# Patient Record
Sex: Female | Born: 1970 | Race: Black or African American | Hispanic: No | Marital: Single
Health system: Southern US, Community
[De-identification: ages and names within clinical notes are randomized; demographics above are authoritative.]

## PROBLEM LIST (undated history)

## (undated) DIAGNOSIS — J9621 Acute and chronic respiratory failure with hypoxia: Secondary | ICD-10-CM

## (undated) DIAGNOSIS — G9341 Metabolic encephalopathy: Secondary | ICD-10-CM

## (undated) DIAGNOSIS — D696 Thrombocytopenia, unspecified: Secondary | ICD-10-CM

## (undated) DIAGNOSIS — R6520 Severe sepsis without septic shock: Secondary | ICD-10-CM

## (undated) DIAGNOSIS — A419 Sepsis, unspecified organism: Secondary | ICD-10-CM

## (undated) DIAGNOSIS — J189 Pneumonia, unspecified organism: Secondary | ICD-10-CM

---

## 2020-04-10 ENCOUNTER — Inpatient Hospital Stay
Admission: RE | Admit: 2020-04-10 | Discharge: 2020-05-09 | Disposition: A | Discharge status: Rehabilitation Facility | Payer: BC Managed Care – PPO | Source: Other Acute Inpatient Hospital | Attending: Internal Medicine | Admitting: Internal Medicine

## 2020-04-10 DIAGNOSIS — G9341 Metabolic encephalopathy: Secondary | ICD-10-CM | POA: Diagnosis present

## 2020-04-10 DIAGNOSIS — D696 Thrombocytopenia, unspecified: Secondary | ICD-10-CM | POA: Diagnosis present

## 2020-04-10 DIAGNOSIS — K819 Cholecystitis, unspecified: Secondary | ICD-10-CM

## 2020-04-10 DIAGNOSIS — J189 Pneumonia, unspecified organism: Secondary | ICD-10-CM | POA: Diagnosis present

## 2020-04-10 DIAGNOSIS — J9621 Acute and chronic respiratory failure with hypoxia: Secondary | ICD-10-CM | POA: Diagnosis present

## 2020-04-10 DIAGNOSIS — Z931 Gastrostomy status: Secondary | ICD-10-CM

## 2020-04-10 DIAGNOSIS — A419 Sepsis, unspecified organism: Secondary | ICD-10-CM | POA: Diagnosis present

## 2020-04-10 HISTORY — DX: Severe sepsis without septic shock: R65.20

## 2020-04-10 HISTORY — DX: Acute and chronic respiratory failure with hypoxia: J96.21

## 2020-04-10 HISTORY — DX: Sepsis, unspecified organism: R65.20

## 2020-04-10 HISTORY — DX: Pneumonia, unspecified organism: J18.9

## 2020-04-10 HISTORY — DX: Metabolic encephalopathy: G93.41

## 2020-04-10 HISTORY — DX: Thrombocytopenia, unspecified: D69.6

## 2020-04-10 HISTORY — DX: Sepsis, unspecified organism: A41.9

## 2020-04-10 LAB — BLOOD GAS, ARTERIAL
Acid-Base Excess: 5.9 mmol/L — ABNORMAL HIGH (ref 0.0–2.0)
Bicarbonate: 29.4 mmol/L — ABNORMAL HIGH (ref 20.0–28.0)
FIO2: 28
O2 Saturation: 98.9 %
Patient temperature: 37.8
pCO2 arterial: 39.9 mmHg (ref 32.0–48.0)
pH, Arterial: 7.483 — ABNORMAL HIGH (ref 7.350–7.450)
pO2, Arterial: 110 mmHg — ABNORMAL HIGH (ref 83.0–108.0)

## 2020-04-11 ENCOUNTER — Other Ambulatory Visit (HOSPITAL_COMMUNITY): Payer: BC Managed Care – PPO

## 2020-04-11 LAB — COMPREHENSIVE METABOLIC PANEL
ALT: 16 U/L (ref 0–44)
AST: 28 U/L (ref 15–41)
Albumin: 1.7 g/dL — ABNORMAL LOW (ref 3.5–5.0)
Alkaline Phosphatase: 127 U/L — ABNORMAL HIGH (ref 38–126)
Anion gap: 8 (ref 5–15)
BUN: <5 mg/dL — ABNORMAL LOW (ref 6–20)
CO2: 28 mmol/L (ref 22–32)
Calcium: 9.1 mg/dL (ref 8.9–10.3)
Chloride: 98 mmol/L (ref 98–111)
Creatinine, Ser: <0.3 mg/dL — ABNORMAL LOW (ref 0.44–1.00)
Glucose, Bld: 102 mg/dL — ABNORMAL HIGH (ref 70–99)
Potassium: 3.8 mmol/L (ref 3.5–5.1)
Sodium: 134 mmol/L — ABNORMAL LOW (ref 135–145)
Total Bilirubin: 1.4 mg/dL — ABNORMAL HIGH (ref 0.3–1.2)
Total Protein: 6.6 g/dL (ref 6.5–8.1)

## 2020-04-11 LAB — CBC
HCT: 32.2 % — ABNORMAL LOW (ref 36.0–46.0)
Hemoglobin: 10.3 g/dL — ABNORMAL LOW (ref 12.0–15.0)
MCH: 29.3 pg (ref 26.0–34.0)
MCHC: 32 g/dL (ref 30.0–36.0)
MCV: 91.7 fL (ref 80.0–100.0)
Platelets: 261 10*3/uL (ref 150–400)
RBC: 3.51 MIL/uL — ABNORMAL LOW (ref 3.87–5.11)
RDW: 21.3 % — ABNORMAL HIGH (ref 11.5–15.5)
WBC: 20.2 10*3/uL — ABNORMAL HIGH (ref 4.0–10.5)
nRBC: 0 % (ref 0.0–0.2)

## 2020-04-11 LAB — MAGNESIUM: Magnesium: 1.4 mg/dL — ABNORMAL LOW (ref 1.7–2.4)

## 2020-04-11 MED ORDER — DSS 100 MG PO CAPS
100.00 | ORAL_CAPSULE | ORAL | Status: DC
Start: 2020-04-11 — End: 2020-04-11

## 2020-04-11 MED ORDER — ALBUTEROL SULFATE (2.5 MG/3ML) 0.083% IN NEBU
2.50 | INHALATION_SOLUTION | RESPIRATORY_TRACT | Status: DC
Start: ? — End: 2020-04-11

## 2020-04-11 MED ORDER — ALPRAZOLAM 0.5 MG PO TABS
1.00 | ORAL_TABLET | ORAL | Status: DC
Start: ? — End: 2020-04-11

## 2020-04-11 MED ORDER — HYDROCORTISONE 2.5 % EX CREA
1.00 | TOPICAL_CREAM | CUTANEOUS | Status: DC
Start: 2020-04-10 — End: 2020-04-11

## 2020-04-11 MED ORDER — HYDRALAZINE HCL 20 MG/ML IJ SOLN
10.00 | INTRAMUSCULAR | Status: DC
Start: ? — End: 2020-04-11

## 2020-04-11 MED ORDER — IPRATROPIUM-ALBUTEROL 0.5-2.5 (3) MG/3ML IN SOLN
3.00 | RESPIRATORY_TRACT | Status: DC
Start: 2020-04-10 — End: 2020-04-11

## 2020-04-11 MED ORDER — NYSTATIN 100000 UNIT/GM EX CREA
1.00 | TOPICAL_CREAM | CUTANEOUS | Status: DC
Start: 2020-04-10 — End: 2020-04-11

## 2020-04-11 MED ORDER — GENERIC EXTERNAL MEDICATION
2.00 | Status: DC
Start: 2020-04-10 — End: 2020-04-11

## 2020-04-11 MED ORDER — ACETAMINOPHEN 160 MG/5ML PO SOLN
650.00 | ORAL | Status: DC
Start: ? — End: 2020-04-11

## 2020-04-11 MED ORDER — MIDAZOLAM HCL 10 MG/10ML IJ SOLN
0.00 | INTRAMUSCULAR | Status: DC
Start: ? — End: 2020-04-11

## 2020-04-11 MED ORDER — IOHEXOL 300 MG/ML  SOLN
40.0000 mL | Freq: Once | INTRAMUSCULAR | Status: AC | PRN
  Administered 2020-04-11: 40 mL

## 2020-04-11 MED ORDER — CARVEDILOL 6.25 MG PO TABS
6.25 | ORAL_TABLET | ORAL | Status: DC
Start: 2020-04-10 — End: 2020-04-11

## 2020-04-11 MED ORDER — ESOMEPRAZOLE MAGNESIUM 40 MG PO PACK
40.00 | PACK | ORAL | Status: DC
Start: 2020-04-10 — End: 2020-04-11

## 2020-04-11 MED ORDER — OXYCODONE HCL 20 MG/ML PO CONC
10.00 | ORAL | Status: DC
Start: 2020-04-10 — End: 2020-04-11

## 2020-04-11 MED ORDER — ALPRAZOLAM 0.5 MG PO TABS
1.00 | ORAL_TABLET | ORAL | Status: DC
Start: 2020-04-10 — End: 2020-04-11

## 2020-04-11 MED ORDER — COCONUT OIL OIL
TOPICAL_OIL | Status: DC
Start: 2020-04-10 — End: 2020-04-11

## 2020-04-11 MED ORDER — GENERIC EXTERNAL MEDICATION
100.00 | Status: DC
Start: 2020-04-11 — End: 2020-04-11

## 2020-04-11 MED ORDER — FENTANYL CITRATE (PF) 50 MCG/ML IJ SOLN
50.00 | INTRAMUSCULAR | Status: DC
Start: ? — End: 2020-04-11

## 2020-04-11 MED ORDER — MORPHINE SULFATE 2 MG/ML IJ SOLN
2.00 | INTRAMUSCULAR | Status: DC
Start: ? — End: 2020-04-11

## 2020-04-11 MED ORDER — WITCH HAZEL-GLYCERIN EX PADS
1.00 | MEDICATED_PAD | CUTANEOUS | Status: DC
Start: ? — End: 2020-04-11

## 2020-04-11 MED ORDER — MULTIVITAMIN & MINERAL PO LIQD
15.00 | ORAL | Status: DC
Start: 2020-04-11 — End: 2020-04-11

## 2020-04-11 MED ORDER — SENNOSIDES-DOCUSATE SODIUM 8.6-50 MG PO TABS
1.00 | ORAL_TABLET | ORAL | Status: DC
Start: 2020-04-10 — End: 2020-04-11

## 2020-04-11 MED ORDER — SUCRALFATE 1 GM/10ML PO SUSP
1.00 | ORAL | Status: DC
Start: 2020-04-10 — End: 2020-04-11

## 2020-04-11 MED ORDER — DEXTROSE 50 % IV SOLN
50.00 | INTRAVENOUS | Status: DC
Start: ? — End: 2020-04-11

## 2020-04-11 MED ORDER — LORAZEPAM 2 MG/ML IJ SOLN
1.00 | INTRAMUSCULAR | Status: DC
Start: ? — End: 2020-04-11

## 2020-04-12 ENCOUNTER — Encounter: Payer: Self-pay | Admitting: Internal Medicine

## 2020-04-12 DIAGNOSIS — J9621 Acute and chronic respiratory failure with hypoxia: Secondary | ICD-10-CM | POA: Diagnosis not present

## 2020-04-12 DIAGNOSIS — A419 Sepsis, unspecified organism: Secondary | ICD-10-CM

## 2020-04-12 DIAGNOSIS — R6520 Severe sepsis without septic shock: Secondary | ICD-10-CM | POA: Diagnosis present

## 2020-04-12 DIAGNOSIS — J189 Pneumonia, unspecified organism: Secondary | ICD-10-CM | POA: Diagnosis present

## 2020-04-12 DIAGNOSIS — G9341 Metabolic encephalopathy: Secondary | ICD-10-CM | POA: Diagnosis not present

## 2020-04-12 DIAGNOSIS — D696 Thrombocytopenia, unspecified: Secondary | ICD-10-CM | POA: Diagnosis present

## 2020-04-12 LAB — MAGNESIUM: Magnesium: 1.5 mg/dL — ABNORMAL LOW (ref 1.7–2.4)

## 2020-04-12 NOTE — Consult Note (Signed)
[facility]  Date of Service: 04/12/2020  PULMONARY CRITICAL CARE CONSULT   Elizabeth Manning  [id number]:[id number]  DOB: [birth year]   DOA: 04/10/2020  Referring Physician: Merton Border, MD  HPI: Elizabeth Manning is a 49 y.o. female seen for follow up of Acute on Chronic Respiratory Failure. Patient has multiple medical problems including alcohol dependence COPD tobacco dependence came into the hospital with being unresponsive. Patient was dropped off by apparently by her boyfriend. Patient had a complicated hospital course was felt initially to be having bilateral pneumonia and also some anoxic brain injury along with severe sepsis. Patient subsequently developed multiorgan failure. Patient was intubated on April 17 and then subsequently was not able to come off of the ventilator. She ended up having to have a tracheostomy on May 11. She has been on the ventilator since then. Patient eventually was transferred to our facility for further management and weaning. Right now is on pressure support has been on an FiO2 of 28% tidal line 391 pressure support 14 PEEP 5  Review of Systems:  ROS performed and is unremarkable other than noted above.  Medical Hx: Past Medical History:  Diagnosis Date  . Alcohol dependence (CMS-HCC)  . COPD (chronic obstructive pulmonary disease) (CMS-HCC)  . Tobacco dependence   Surgical Hx: Past Surgical History:  Procedure Laterality Date  . ANKLE SURGERY  . HERNIA REPAIR  . KNEE SURGERY    Allergies:  Patient has no known allergies.  Social Hx:  Tobacco use: reports that she has been smoking cigarettes. She has been smoking about 0.25 packs per day. She has never used smokeless tobacco. Alcohol use: Chronic alcoholism  Drug use: no known drug use, last urine drug screen was negative Lives in a private residence.  Family History: No family history on file.   Medications: Reviewed on  Rounds  Physical Exam:  Vitals: Temperature was 98.6 pulse 93 respiratory rate 35 blood pressure is 170/86 saturations 100%  Ventilator Settings on pressure support FiO2 of 28% tidal volume is 391 pressure support 14 PEEP 5  . General: Comfortable at this time . Eyes: Grossly normal lids, irises & conjunctiva . ENT: grossly tongue is normal . Neck: no obvious mass . Cardiovascular: S1-S2 normal no gallop or rub . Respiratory: No rhonchi no rales are noted at this time . Abdomen: Soft and nontender . Skin: no rash seen on limited exam . Musculoskeletal: not rigid . Psychiatric:unable to assess . Neurologic: no seizure no involuntary movements         Labs on Admission:  Basic Metabolic Panel: Recent Labs  Lab 04/11/20 0629 04/12/20 1006  NA 134*  --   K 3.8  --   CL 98  --   CO2 28  --   GLUCOSE 102*  --   BUN <5*  --   CREATININE <0.30*  --   CALCIUM 9.1  --   MG 1.4* 1.5*    Recent Labs  Lab 04/10/20 2122  PHART 7.483*  PCO2ART 39.9  PO2ART 110*  HCO3 29.4*  O2SAT 98.9    Liver Function Tests: Recent Labs  Lab 04/11/20 0629  AST 28  ALT 16  ALKPHOS 127*  BILITOT 1.4*  PROT 6.6  ALBUMIN 1.7*   No results for input(s): LIPASE, AMYLASE in the last 168 hours. No results for input(s): AMMONIA in the last 168 hours.  CBC: Recent Labs  Lab 04/11/20 0629  WBC 20.2*  HGB  10.3*  HCT 32.2*  MCV 91.7  PLT 261    Cardiac Enzymes: No results for input(s): CKTOTAL, CKMB, CKMBINDEX, TROPONINI in the last 168 hours.  BNP (last 3 results) No results for input(s): BNP in the last 8760 hours.  ProBNP (last 3 results) No results for input(s): PROBNP in the last 8760 hours.   Radiological Exams on Admission: DG ABDOMEN PEG TUBE LOCATION  Result Date: 04/11/2020 CLINICAL DATA:  Tube placement EXAM: ABDOMEN - 1 VIEW COMPARISON:  None. FINDINGS: Injection of contrast through the pre-existing PEG tube opacifies the stomach. There is no free air. The bowel  gas pattern is nonobstructive. IMPRESSION: Peg tube in the stomach. Electronically Signed   By: Katherine Mantle M.D.   On: 04/11/2020 02:58   DG CHEST PORT 1 VIEW  Result Date: 04/11/2020 CLINICAL DATA:  Tube placement EXAM: PORTABLE CHEST 1 VIEW COMPARISON:  None. FINDINGS: The tracheostomy tube terminates above the carina. There is a right-sided PICC line is well positioned. There is a left-sided pleural effusion. There is a probable small right-sided pleural effusion. There is cardiomegaly with vascular congestion and findings of interstitial edema. There is no pneumothorax. There are few bilateral hazy airspace opacities. There is no acute osseous abnormality. IMPRESSION: 1. Lines and tubes as above. 2. Cardiomegaly with findings of interstitial edema. An underlying atypical infectious process is not excluded. 3. Small bilateral pleural effusions. Electronically Signed   By: Katherine Mantle M.D.   On: 04/11/2020 02:57    Assessment/Plan Active Problems:   Acute on chronic respiratory failure with hypoxia (HCC)   Severe sepsis (HCC)   Metabolic encephalopathy   Bilateral pneumonia   Thrombocytopenia (HCC)   1. Acute on chronic respiratory failure with hypoxia patient yesterday was started on the weaning protocol and did approximately 4 hours on pressure support weaning. We are going to continue to advance beyond 4 hours if at all possible. Patient still has some issues with anxiety. 2. Severe sepsis she received multiple rounds of antibiotics including cefepime meropenem Rocephin Levaquin and also was placed on antifungal therapy in the form of micafungin and patient is supposed to continue this ongoing. 3. Metabolic encephalopathy felt to be multifactorial it was felt that she also has a component of anoxic brain injury we will see how she does going forward. 4. Bilateral pneumonia as already mentioned above patient has been treated with multiple rounds of antibiotics. Right now is on  antifungal therapy also plan is going to be to continue with antibiotics to completion. 5. Thrombocytopenia we will continue to follow patient did have recovery of the labs.  I have personally seen and evaluated the patient, evaluated laboratory and imaging results, formulated the assessment and plan and placed orders. The Patient requires high complexity decision making with multiple systems involvement.  Case was discussed on Rounds with the Respiratory Therapy Director and the Respiratory staff Time Spent  Yevonne Pax, MD [facility] Pulmonary Critical Care Medicine Sleep Medicine

## 2020-04-13 DIAGNOSIS — G9341 Metabolic encephalopathy: Secondary | ICD-10-CM | POA: Diagnosis not present

## 2020-04-13 DIAGNOSIS — A419 Sepsis, unspecified organism: Secondary | ICD-10-CM | POA: Diagnosis not present

## 2020-04-13 DIAGNOSIS — J9621 Acute and chronic respiratory failure with hypoxia: Secondary | ICD-10-CM | POA: Diagnosis not present

## 2020-04-13 DIAGNOSIS — J189 Pneumonia, unspecified organism: Secondary | ICD-10-CM | POA: Diagnosis not present

## 2020-04-13 LAB — MAGNESIUM: Magnesium: 1.4 mg/dL — ABNORMAL LOW (ref 1.7–2.4)

## 2020-04-13 NOTE — Progress Notes (Signed)
Pulmonary Critical Care Medicine [facility] [place]   PULMONARY CRITICAL CARE SERVICE  PROGRESS NOTE  Date of Service: 04/13/2020  Elizabeth Manning  [id number]  DOB: 06/10/1971   DOA: 04/10/2020  Referring Physician: Carron Curie, MD  HPI: Elizabeth Manning is a 49 y.o. female seen for follow up of Acute on Chronic Respiratory Failure.  Patient currently is on pressure support mode today's goal should be about 8 hours on the wean on pressure support  Medications: Reviewed on Rounds  Physical Exam:  Vitals: Temperature is 96.9 pulse 85 respiratory rate 25 blood pressure is 172/87 saturations 98%  Ventilator Settings on pressure support FiO2 28% pressure poor 12 PEEP 5 tidal volume 304  . General: Comfortable at this time . Eyes: Grossly normal lids, irises & conjunctiva . ENT: grossly tongue is normal . Neck: no obvious mass . Cardiovascular: S1 S2 normal no gallop . Respiratory: No rhonchi coarse breath sounds . Abdomen: soft . Skin: no rash seen on limited exam . Musculoskeletal: not rigid . Psychiatric:unable to assess . Neurologic: no seizure no involuntary movements         Lab Data:   Basic Metabolic Panel: Recent Labs  Lab 04/11/20 0629 04/12/20 1006 04/13/20 1328  NA 134*  --   --   K 3.8  --   --   CL 98  --   --   CO2 28  --   --   GLUCOSE 102*  --   --   BUN <5*  --   --   CREATININE <0.30*  --   --   CALCIUM 9.1  --   --   MG 1.4* 1.5* 1.4*    ABG: Recent Labs  Lab 04/10/20 2122  PHART 7.483*  PCO2ART 39.9  PO2ART 110*  HCO3 29.4*  O2SAT 98.9    Liver Function Tests: Recent Labs  Lab 04/11/20 0629  AST 28  ALT 16  ALKPHOS 127*  BILITOT 1.4*  PROT 6.6  ALBUMIN 1.7*   No results for input(s): LIPASE, AMYLASE in the last 168 hours. No results for input(s): AMMONIA in the last 168 hours.  CBC: Recent Labs  Lab 04/11/20 0629  WBC 20.2*  HGB 10.3*  HCT 32.2*  MCV 91.7  PLT 261    Cardiac Enzymes: No  results for input(s): CKTOTAL, CKMB, CKMBINDEX, TROPONINI in the last 168 hours.  BNP (last 3 results) No results for input(s): BNP in the last 8760 hours.  ProBNP (last 3 results) No results for input(s): PROBNP in the last 8760 hours.  Radiological Exams: No results found.  Assessment/Plan Active Problems:   Acute on chronic respiratory failure with hypoxia (HCC)   Severe sepsis (HCC)   Metabolic encephalopathy   Bilateral pneumonia   Thrombocytopenia (HCC)   1. Acute on chronic respiratory failure with hypoxia plan is to continue with the weaning protocol today the goal is 8 hours on pressure support. 2. Severe sepsis resolved hemodynamics are stable 3. Metabolic encephalopathy no change we will continue to monitor 4. Bilateral pneumonia treated we will continue with supportive care 5. Thrombocytopenia no change noted at this time we will continue present management   I have personally seen and evaluated the patient, evaluated laboratory and imaging results, formulated the assessment and plan and placed orders. The Patient requires high complexity decision making with multiple systems involvement.  Rounds were done with the Respiratory Therapy Director and Staff therapists and discussed with nursing staff also.  Yevonne Pax,  MD [facility] Pulmonary Critical Care Medicine Sleep Medicine

## 2020-04-14 DIAGNOSIS — A419 Sepsis, unspecified organism: Secondary | ICD-10-CM | POA: Diagnosis not present

## 2020-04-14 DIAGNOSIS — J189 Pneumonia, unspecified organism: Secondary | ICD-10-CM | POA: Diagnosis not present

## 2020-04-14 DIAGNOSIS — G9341 Metabolic encephalopathy: Secondary | ICD-10-CM | POA: Diagnosis not present

## 2020-04-14 DIAGNOSIS — J9621 Acute and chronic respiratory failure with hypoxia: Secondary | ICD-10-CM | POA: Diagnosis not present

## 2020-04-14 LAB — MAGNESIUM: Magnesium: 1.4 mg/dL — ABNORMAL LOW (ref 1.7–2.4)

## 2020-04-14 NOTE — Progress Notes (Addendum)
Pulmonary Critical Care Medicine [facility] [place]   PULMONARY CRITICAL CARE SERVICE  PROGRESS NOTE  Date of Service: 04/14/2020  Elizabeth Manning  [id number]  DOB: 1971/09/08   DOA: 04/10/2020  Referring Physician: Carron Curie, MD  HPI: Elizabeth Manning is a 49 y.o. female seen for follow up of Acute on Chronic Respiratory Failure.  Patient currently is on pressure support mode has been on 28% FiO2 with good saturations the goal today is for about 12 hours  Medications: Reviewed on Rounds  Physical Exam:  Vitals: Temperature is 97.1 pulse 91 respiratory 24 blood pressure is 120/74 saturations 100%  Ventilator Settings on pressure support FiO2 28% pressure support 12/5  . General: Comfortable at this time . Eyes: Grossly normal lids, irises & conjunctiva . ENT: grossly tongue is normal . Neck: no obvious mass . Cardiovascular: S1 S2 normal no gallop . Respiratory: No rhonchi no rales are noted at this time . Abdomen: soft . Skin: no rash seen on limited exam . Musculoskeletal: not rigid . Psychiatric:unable to assess . Neurologic: no seizure no involuntary movements         Lab Data:   Basic Metabolic Panel: Recent Labs  Lab 04/11/20 0629 04/12/20 1006 04/13/20 1328 04/14/20 0739  NA 134*  --   --   --   K 3.8  --   --   --   CL 98  --   --   --   CO2 28  --   --   --   GLUCOSE 102*  --   --   --   BUN <5*  --   --   --   CREATININE <0.30*  --   --   --   CALCIUM 9.1  --   --   --   MG 1.4* 1.5* 1.4* 1.4*    ABG: Recent Labs  Lab 04/10/20 2122  PHART 7.483*  PCO2ART 39.9  PO2ART 110*  HCO3 29.4*  O2SAT 98.9    Liver Function Tests: Recent Labs  Lab 04/11/20 0629  AST 28  ALT 16  ALKPHOS 127*  BILITOT 1.4*  PROT 6.6  ALBUMIN 1.7*   No results for input(s): LIPASE, AMYLASE in the last 168 hours. No results for input(s): AMMONIA in the last 168 hours.  CBC: Recent Labs  Lab 04/11/20 0629  WBC 20.2*  HGB 10.3*  HCT  32.2*  MCV 91.7  PLT 261    Cardiac Enzymes: No results for input(s): CKTOTAL, CKMB, CKMBINDEX, TROPONINI in the last 168 hours.  BNP (last 3 results) No results for input(s): BNP in the last 8760 hours.  ProBNP (last 3 results) No results for input(s): PROBNP in the last 8760 hours.  Radiological Exams: No results found.  Assessment/Plan Active Problems:   Acute on chronic respiratory failure with hypoxia (HCC)   Severe sepsis (HCC)   Metabolic encephalopathy   Bilateral pneumonia   Thrombocytopenia (HCC)   1. Acute on chronic respiratory failure hypoxia we will continue with pressure support titrate oxygen as tolerated continue secretion management supportive care. 2. Severe sepsis resolved we will continue the present management 3. Metabolic encephalopathy no change supportive care 4. Bilateral pneumonia treated clinically improved 5. Thrombocytopenia platelet counts have normalized we will continue to monitor   I have personally seen and evaluated the patient, evaluated laboratory and imaging results, formulated the assessment and plan and placed orders. The Patient requires high complexity decision making with multiple systems involvement.  Rounds were  done with the Respiratory Therapy Director and Staff therapists and discussed with nursing staff also.  Allyne Gee, MD [facility] Pulmonary Critical Care Medicine Sleep Medicine

## 2020-04-15 DIAGNOSIS — J189 Pneumonia, unspecified organism: Secondary | ICD-10-CM | POA: Diagnosis not present

## 2020-04-15 DIAGNOSIS — G9341 Metabolic encephalopathy: Secondary | ICD-10-CM | POA: Diagnosis not present

## 2020-04-15 DIAGNOSIS — A419 Sepsis, unspecified organism: Secondary | ICD-10-CM | POA: Diagnosis not present

## 2020-04-15 DIAGNOSIS — J9621 Acute and chronic respiratory failure with hypoxia: Secondary | ICD-10-CM | POA: Diagnosis not present

## 2020-04-15 LAB — MAGNESIUM: Magnesium: 1.4 mg/dL — ABNORMAL LOW (ref 1.7–2.4)

## 2020-04-15 NOTE — Progress Notes (Signed)
Pulmonary Critical Care Medicine [facility] [place]   PULMONARY CRITICAL CARE SERVICE  PROGRESS NOTE  Date of Service: 04/15/2020  Elizabeth Manning  [id number]  DOB: [birth year]   DOA: 04/10/2020  Referring Physician: Carron Curie, MD  HPI: Elizabeth Manning is a 49 y.o. female seen for follow up of Acute on Chronic Respiratory Failure.  Patient currently is on pressure support has been on 20% FiO2 with a goal of 16 hours today  Medications: Reviewed on Rounds  Physical Exam:  Vitals: Temperature is 97.2 pulse 87 respiratory rate 30 blood pressure is 121/75 saturations 100%  Ventilator Settings on pressure support FiO2 28% tidal volume 400 pressure support 12 PEEP 5  . General: Comfortable at this time . Eyes: Grossly normal lids, irises & conjunctiva . ENT: grossly tongue is normal . Neck: no obvious mass . Cardiovascular: S1 S2 normal no gallop . Respiratory: No rhonchi no rales are noted at this time . Abdomen: soft . Skin: no rash seen on limited exam . Musculoskeletal: not rigid . Psychiatric:unable to assess . Neurologic: no seizure no involuntary movements         Lab Data:   Basic Metabolic Panel: Recent Labs  Lab 04/11/20 0629 04/12/20 1006 04/13/20 1328 04/14/20 0739 04/15/20 0806  NA 134*  --   --   --   --   K 3.8  --   --   --   --   CL 98  --   --   --   --   CO2 28  --   --   --   --   GLUCOSE 102*  --   --   --   --   BUN <5*  --   --   --   --   CREATININE <0.30*  --   --   --   --   CALCIUM 9.1  --   --   --   --   MG 1.4* 1.5* 1.4* 1.4* 1.4*    ABG: Recent Labs  Lab 04/10/20 2122  PHART 7.483*  PCO2ART 39.9  PO2ART 110*  HCO3 29.4*  O2SAT 98.9    Liver Function Tests: Recent Labs  Lab 04/11/20 0629  AST 28  ALT 16  ALKPHOS 127*  BILITOT 1.4*  PROT 6.6  ALBUMIN 1.7*   No results for input(s): LIPASE, AMYLASE in the last 168 hours. No results for input(s): AMMONIA in the last 168 hours.  CBC: Recent Labs   Lab 04/11/20 0629  WBC 20.2*  HGB 10.3*  HCT 32.2*  MCV 91.7  PLT 261    Cardiac Enzymes: No results for input(s): CKTOTAL, CKMB, CKMBINDEX, TROPONINI in the last 168 hours.  BNP (last 3 results) No results for input(s): BNP in the last 8760 hours.  ProBNP (last 3 results) No results for input(s): PROBNP in the last 8760 hours.  Radiological Exams: No results found.  Assessment/Plan Active Problems:   Acute on chronic respiratory failure with hypoxia (HCC)   Severe sepsis (HCC)   Metabolic encephalopathy   Bilateral pneumonia   Thrombocytopenia (HCC)   1. Acute on chronic respiratory failure hypoxia we will continue to wean on pressure support today's goal is 16 hours 2. Severe sepsis resolved hemodynamics are stable 3. Metabolic encephalopathy no change 4. Bilateral pneumonia treated we will continue to follow 5. Thrombocytopenia patient is at baseline   I have personally seen and evaluated the patient, evaluated laboratory and imaging results, formulated the assessment  and plan and placed orders. The Patient requires high complexity decision making with multiple systems involvement.  Rounds were done with the Respiratory Therapy Director and Staff therapists and discussed with nursing staff also.  Allyne Gee, MD [facility] Pulmonary Critical Care Medicine Sleep Medicine

## 2020-04-16 DIAGNOSIS — A419 Sepsis, unspecified organism: Secondary | ICD-10-CM | POA: Diagnosis not present

## 2020-04-16 DIAGNOSIS — G9341 Metabolic encephalopathy: Secondary | ICD-10-CM | POA: Diagnosis not present

## 2020-04-16 DIAGNOSIS — J9621 Acute and chronic respiratory failure with hypoxia: Secondary | ICD-10-CM | POA: Diagnosis not present

## 2020-04-16 DIAGNOSIS — J189 Pneumonia, unspecified organism: Secondary | ICD-10-CM | POA: Diagnosis not present

## 2020-04-16 LAB — BASIC METABOLIC PANEL
Anion gap: 10 (ref 5–15)
Anion gap: 10 (ref 5–15)
BUN: 7 mg/dL (ref 6–20)
BUN: 7 mg/dL (ref 6–20)
CO2: 28 mmol/L (ref 22–32)
CO2: 28 mmol/L (ref 22–32)
Calcium: 9.4 mg/dL (ref 8.9–10.3)
Calcium: 9.5 mg/dL (ref 8.9–10.3)
Chloride: 81 mmol/L — ABNORMAL LOW (ref 98–111)
Chloride: 82 mmol/L — ABNORMAL LOW (ref 98–111)
Creatinine, Ser: <0.3 mg/dL — ABNORMAL LOW (ref 0.44–1.00)
Creatinine, Ser: <0.3 mg/dL — ABNORMAL LOW (ref 0.44–1.00)
Glucose, Bld: 119 mg/dL — ABNORMAL HIGH (ref 70–99)
Glucose, Bld: 100 mg/dL — ABNORMAL HIGH (ref 70–99)
Potassium: 4.5 mmol/L (ref 3.5–5.1)
Potassium: 5.1 mmol/L (ref 3.5–5.1)
Sodium: 119 mmol/L — CL (ref 135–145)
Sodium: 120 mmol/L — ABNORMAL LOW (ref 135–145)

## 2020-04-16 LAB — CBC
HCT: 32 % — ABNORMAL LOW (ref 36.0–46.0)
Hemoglobin: 10.4 g/dL — ABNORMAL LOW (ref 12.0–15.0)
MCH: 29.2 pg (ref 26.0–34.0)
MCHC: 32.5 g/dL (ref 30.0–36.0)
MCV: 89.9 fL (ref 80.0–100.0)
Platelets: 422 10*3/uL — ABNORMAL HIGH (ref 150–400)
RBC: 3.56 MIL/uL — ABNORMAL LOW (ref 3.87–5.11)
RDW: 18.3 % — ABNORMAL HIGH (ref 11.5–15.5)
WBC: 21.6 10*3/uL — ABNORMAL HIGH (ref 4.0–10.5)
nRBC: 0 % (ref 0.0–0.2)

## 2020-04-16 LAB — MAGNESIUM: Magnesium: 1.4 mg/dL — ABNORMAL LOW (ref 1.7–2.4)

## 2020-04-16 LAB — TSH: TSH: 8.771 u[IU]/mL — ABNORMAL HIGH (ref 0.350–4.500)

## 2020-04-16 NOTE — Progress Notes (Signed)
Pulmonary Critical Care Medicine [facility] [place]   PULMONARY CRITICAL CARE SERVICE  PROGRESS NOTE  Date of Service: 04/16/2020  Elizabeth Manning  [id number]  DOB: 11/07/1971   DOA: 04/10/2020  Referring Physician: Carron Curie, MD  HPI: Elizabeth Manning is a 49 y.o. female seen for follow up of Acute on Chronic Respiratory Failure.  Patient currently is on T collar has been on 28% FiO2 trying for a goal of about 4 hours off the ventilator  Medications: Reviewed on Rounds  Physical Exam:  Vitals: Temperature is 97.8 pulse 93 respiratory rate 30 blood pressure is 126/71 saturations 100%  Ventilator Settings on T collar with an FiO2 of 28%  . General: Comfortable at this time . Eyes: Grossly normal lids, irises & conjunctiva . ENT: grossly tongue is normal . Neck: no obvious mass . Cardiovascular: S1 S2 normal no gallop . Respiratory: No rhonchi coarse breath sounds are noted . Abdomen: soft . Skin: no rash seen on limited exam . Musculoskeletal: not rigid . Psychiatric:unable to assess . Neurologic: no seizure no involuntary movements         Lab Data:   Basic Metabolic Panel: Recent Labs  Lab 04/11/20 0629 04/11/20 0629 04/12/20 1006 04/13/20 1328 04/14/20 0739 04/15/20 0806 04/16/20 0723  NA 134*  --   --   --   --   --  119*  K 3.8  --   --   --   --   --  4.5  CL 98  --   --   --   --   --  81*  CO2 28  --   --   --   --   --  28  GLUCOSE 102*  --   --   --   --   --  119*  BUN <5*  --   --   --   --   --  7  CREATININE <0.30*  --   --   --   --   --  <0.30*  CALCIUM 9.1  --   --   --   --   --  9.4  MG 1.4*   < > 1.5* 1.4* 1.4* 1.4* 1.4*   < > = values in this interval not displayed.    ABG: Recent Labs  Lab 04/10/20 2122  PHART 7.483*  PCO2ART 39.9  PO2ART 110*  HCO3 29.4*  O2SAT 98.9    Liver Function Tests: Recent Labs  Lab 04/11/20 0629  AST 28  ALT 16  ALKPHOS 127*  BILITOT 1.4*  PROT 6.6  ALBUMIN 1.7*   No  results for input(s): LIPASE, AMYLASE in the last 168 hours. No results for input(s): AMMONIA in the last 168 hours.  CBC: Recent Labs  Lab 04/11/20 0629 04/16/20 0723  WBC 20.2* 21.6*  HGB 10.3* 10.4*  HCT 32.2* 32.0*  MCV 91.7 89.9  PLT 261 422*    Cardiac Enzymes: No results for input(s): CKTOTAL, CKMB, CKMBINDEX, TROPONINI in the last 168 hours.  BNP (last 3 results) No results for input(s): BNP in the last 8760 hours.  ProBNP (last 3 results) No results for input(s): PROBNP in the last 8760 hours.  Radiological Exams: No results found.  Assessment/Plan Active Problems:   Acute on chronic respiratory failure with hypoxia (HCC)   Severe sepsis (HCC)   Metabolic encephalopathy   Bilateral pneumonia   Thrombocytopenia (HCC)   1. Acute on chronic respiratory failure hypoxia plan is  to continue with T collar trials titrate oxygen as tolerated and at the goal today is going to be up to 4 hours 2. Severe sepsis resolved hemodynamics are stable we will continue to monitor. 3. Metabolic encephalopathy slow improvement 4. Bilateral pneumonia treated clinically is improving 5. Thrombocytopenia monitoring labs   I have personally seen and evaluated the patient, evaluated laboratory and imaging results, formulated the assessment and plan and placed orders. The Patient requires high complexity decision making with multiple systems involvement.  Rounds were done with the Respiratory Therapy Director and Staff therapists and discussed with nursing staff also.  Allyne Gee, MD [facility] Pulmonary Critical Care Medicine Sleep Medicine

## 2020-04-17 DIAGNOSIS — A419 Sepsis, unspecified organism: Secondary | ICD-10-CM | POA: Diagnosis not present

## 2020-04-17 DIAGNOSIS — J9621 Acute and chronic respiratory failure with hypoxia: Secondary | ICD-10-CM | POA: Diagnosis not present

## 2020-04-17 DIAGNOSIS — J189 Pneumonia, unspecified organism: Secondary | ICD-10-CM | POA: Diagnosis not present

## 2020-04-17 DIAGNOSIS — G9341 Metabolic encephalopathy: Secondary | ICD-10-CM | POA: Diagnosis not present

## 2020-04-17 LAB — BASIC METABOLIC PANEL
Anion gap: 7 (ref 5–15)
BUN: 5 mg/dL — ABNORMAL LOW (ref 6–20)
CO2: 27 mmol/L (ref 22–32)
Calcium: 9.2 mg/dL (ref 8.9–10.3)
Chloride: 89 mmol/L — ABNORMAL LOW (ref 98–111)
Creatinine, Ser: <0.3 mg/dL — ABNORMAL LOW (ref 0.44–1.00)
Glucose, Bld: 112 mg/dL — ABNORMAL HIGH (ref 70–99)
Potassium: 4.5 mmol/L (ref 3.5–5.1)
Sodium: 123 mmol/L — ABNORMAL LOW (ref 135–145)

## 2020-04-17 NOTE — Consult Note (Signed)
Medical Consultation   Elizabeth Manning  RKY:[id number]  DOB: March 18, 1971  DOA: 04/10/2020  Requesting physician: Dr.Hijazi  Reason for consultation: Antibiotic recommendations   History of Present Illness: Elizabeth Manning is an 49 y.o. female that presented to [facility] ED on 03/08/2020 after she was found unresponsive.  Patient apparently was dropped off to the ED by her boyfriend with a report that she was found unresponsive at home.  No other history known.  She is a known chronic alcoholic.  Evaluation in the ED showed multilobar pneumonia.  She was started on vancomycin, cefepime, Levaquin in the ED.  She was intubated for airway protection.  Patient was also found to have hypotension and was given fluid bolus and pressors.  Patient underwent tracheostomy on 04/01/2020.  She was treated for severe sepsis with shock.  Currently off pressors.  She had hypotension, hypothermia.  It was thought to be secondary to pneumonia from Streptococcus.  She also had UTI with Klebsiella pneumonia.  Fungitell was negative.  She was treated with ceftriaxone for the Klebsiella pneumonia UTI.  After treatment patient continued to have spiking fevers on 5/1 and 03/26/2020.  Her central line was replaced and PICC line was placed.  She had sputum cultures from 03/27/2020 that showed multidrug-resistant E. coli and she was treated with meropenem which she completed on 04/06/2020.  At the time of discharge she was on micafungin and cefepime for fever of unknown origin, persistent leukocytosis. She was treated with the following antibiotics at the outside facility: Antibiotic course: IV cefepime 5/2-5/9, restarted 5/19-present IV micafungin 5/19-present IV meropenem 5/9-5/16 IV Rocephin 4/23-4/27, 4/28-5/2 IV Levaquin 4/17-4/18, 4/22-4/26, 4/28-5/2  Oral Diflucan 4/30-present IV cefepime 4/18-4/22 Pulse dose IV vancomycin 4/18-4/22 She also had thrombocytopenia which improved.  She had vaginal candidiasis  and was treated with Diflucan for 3 days.  Once the patient stabilized she was transferred to [facility] on 04/10/2020.  She is currently on cefepime, Eraxis.  Her WBC count has been worsening.  Today 21.6.  Etiology unclear at this time.  She currently has a trach in place.  Awake, has debility with severe generalized weakness.  Review of Systems:  She has a trach in place, nonverbal, unable to obtain review of systems at this time.  Past Medical History: Past Medical History:  Diagnosis Date  . Acute on chronic respiratory failure with hypoxia ([place])   . Bilateral pneumonia   . Metabolic encephalopathy   . Severe sepsis ([place])   . Thrombocytopenia ([place])   Alcohol dependence (CMS-HCC)  . COPD (chronic obstructive pulmonary disease) (CMS-HCC)  . Tobacco dependence    Past Surgical History: . ANKLE SURGERY  . HERNIA REPAIR  . KNEE SURGERY    Allergies: No known drug allergies  Social History: Tobacco use: Per records from the outside facility smoking about 0.25 packs per day. She has never used smokeless tobacco. Alcohol use: Chronic alcoholism  Drug use: no known drug use  Family History: No family history on file.  Unable to obtain at this time.   Physical Exam: Temperature 97.8, blood pressure 126/71, pulse 93, respiratory rate 34, pulse oximetry 100%  Constitutional: Ill-appearing female, awake, opening eyes Head: Atraumatic, normocephalic, pupils equal and reactive Eyes: PERLA, EOMI  ENMT: external ears and nose appear normal, normal hearing, Lips appears normal, moist oral mucosa Neck: Has trach in place CVS: S1-S2 clear, no murmur  Respiratory: Coarse breath sounds, rhonchi, no wheezing,  rales or rhonchi.  Abdomen: soft nontender, nondistended, positive bowel sounds, PEG tube in place Musculoskeletal: No edema Neuro: She is awake but confused, not following commands, she has severe debility with generalized weakness Psych: Confused Skin: no  rashes  Data reviewed:  I have personally reviewed following labs and imaging studies Labs:  CBC: Recent Labs  Lab 04/11/20 0629 04/16/20 0723  WBC 20.2* 21.6*  HGB 10.3* 10.4*  HCT 32.2* 32.0*  MCV 91.7 89.9  PLT 261 422*    Basic Metabolic Panel: Recent Labs  Lab 04/11/20 0629 04/11/20 0629 04/12/20 1006 04/13/20 1328 04/14/20 0739 04/15/20 0806 04/16/20 0723 04/16/20 0723 04/16/20 1300 04/17/20 0758  NA 134*  --   --   --   --   --  119*  --  120* 123*  K 3.8   < >  --   --   --   --  4.5   < > 5.1 4.5  CL 98  --   --   --   --   --  81*  --  82* 89*  CO2 28  --   --   --   --   --  28  --  28 27  GLUCOSE 102*  --   --   --   --   --  119*  --  100* 112*  BUN <5*  --   --   --   --   --  7  --  7 5*  CREATININE <0.30*  --   --   --   --   --  <0.30*  --  <0.30* <0.30*  CALCIUM 9.1  --   --   --   --   --  9.4  --  9.5 9.2  MG 1.4*   < > 1.5* 1.4* 1.4* 1.4* 1.4*  --   --   --    < > = values in this interval not displayed.   GFR CrCl cannot be calculated (This lab value cannot be used to calculate CrCl because it is not a number: <0.30). Liver Function Tests: Recent Labs  Lab 04/11/20 0629  AST 28  ALT 16  ALKPHOS 127*  BILITOT 1.4*  PROT 6.6  ALBUMIN 1.7*   No results for input(s): LIPASE, AMYLASE in the last 168 hours. No results for input(s): AMMONIA in the last 168 hours. Coagulation profile No results for input(s): INR, PROTIME in the last 168 hours.  Cardiac Enzymes: No results for input(s): CKTOTAL, CKMB, CKMBINDEX, TROPONINI in the last 168 hours. BNP: Invalid input(s): POCBNP CBG: No results for input(s): GLUCAP in the last 168 hours. D-Dimer No results for input(s): DDIMER in the last 72 hours. Hgb A1c No results for input(s): HGBA1C in the last 72 hours. Lipid Profile No results for input(s): CHOL, HDL, LDLCALC, TRIG, CHOLHDL, LDLDIRECT in the last 72 hours. Thyroid function studies Recent Labs    04/16/20 1306  TSH 8.771*    Anemia work up No results for input(s): VITAMINB12, FOLATE, FERRITIN, TIBC, IRON, RETICCTPCT in the last 72 hours. Urinalysis No results found for: COLORURINE, APPEARANCEUR, LABSPEC, PHURINE, GLUCOSEU, HGBUR, BILIRUBINUR, KETONESUR, PROTEINUR, UROBILINOGEN, NITRITE, LEUKOCYTESUR   Microbiology No results found for this or any previous visit (from the past 240 hour(s)).     Inpatient Medications:   Scheduled Meds: Please see MAR   Radiological Exams on Admission: No results found.  Impression/Recommendations Active Problems:   Acute on chronic respiratory failure with hypoxia (  HCC)   Severe sepsis (HCC)   Metabolic encephalopathy   Bilateral pneumonia   Thrombocytopenia (HCC) UTI with Klebsiella Fever/leukocytosis Chronic alcohol abuse COPD Dysphagia/protein calorie malnutrition Hyponatremia  Acute on chronic hypoxemic respiratory failure: Patient had pneumonia at the outside facility with respiratory cultures that showed Streptococcus.  She received multiple antibiotics as mentioned above.  Currently on cefepime, Eraxis for fever of unknown origin.  Do not think she needs the Eraxis as no fungal infection that we can see.  She has dysphagia, high suspicion for aspiration.  Would recommend to continue the cefepime.  We will also add Flagyl.  Recommend to send respiratory cultures.  Pulmonary following.  Pneumonia: Patient had respiratory cultures at the outside facility that showed Streptococcus.  She also had bilateral pneumonia.  Currently on treatment with cefepime.  Also high suspicion for aspiration which could be contributing to the fever, worsening leukocytosis.  Would recommend to add Flagyl.  We will plan to treat for total duration of 1 week pending improvement. Unfortunately she is at high risk for worsening pneumonia despite being on antibiotics due to high suspicion for aspiration.  Suggest aggressive pulmonary toileting, chest PT.  Fever/leukocytosis: Patient  found to have worsening WBC count.  Currently on treatment with cefepime, Eraxis.  She has dysphagia and high suspicion for aspiration which could be contributing to the worsening fever, leukocytosis despite being on antimicrobials.  Would recommend respiratory cultures.  If she has fever greater than 101 would also recommend to send for blood cultures.  Do not think she needs Eraxis.  Continue treatment with cefepime.  Will also add Flagyl for anaerobic coverage.  Agree with CT of the chest/abdomen/pelvis to evaluate for other etiology as she did have fever spikes even when she was at the acute facility outside.  Continue to monitor.  Encephalopathy: She likely has toxic/metabolic encephalopathy.  She also has hyponatremia that is being managed by the primary team.  Antibiotics as mentioned above.  Continue supportive management per the primary team.  Chronic alcohol abuse: Patient has alcohol abuse history.  Continue with high-dose thiamine for possible Wernicke's/Korsakoff.  Continue supportive management per the primary team.  UTI: She had urine cultures done in the outside facility that showed Klebsiella.  She was already treated for the UTI.  COPD: Continue nebulizers, management per primary team and pulmonary.  Dysphagia/protein calorie malnutrition: On tube feeds.  Due to her dysphagia she is high risk for aspiration and worsening respiratory failure secondary to aspiration pneumonia.  Due to her complex medical problems she is high risk for worsening and decompensation.  Plan of care discussed with the primary team and pharmacy.  Thank you for this consultation.    Vonzella Nipple M.D. 04/17/2020, 2:25 PM

## 2020-04-17 NOTE — Progress Notes (Signed)
Pulmonary Critical Care Medicine [place]   PULMONARY CRITICAL CARE SERVICE  PROGRESS NOTE  Date of Service: 04/17/2020  Elizabeth Manning  FGH:[id number]  DOB: 1971-11-03   DOA: 04/10/2020  Referring Physician: Merton Border, MD  HPI: Elizabeth Manning is a 49 y.o. female seen for follow up of Acute on Chronic Respiratory Failure.  Patient currently is on T collar has been on 28% FiO2 the goal is for about 8 hours  Medications: Reviewed on Rounds  Physical Exam:  Vitals: Temperature is 98.6 pulse 93 respiratory rate 30 blood pressure is 109/64 saturations 99%  Ventilator Settings on T collar FiO2 28%  . General: Comfortable at this time . Eyes: Grossly normal lids, irises & conjunctiva . ENT: grossly tongue is normal . Neck: no obvious mass . Cardiovascular: S1 S2 normal no gallop . Respiratory: No rhonchi coarse breath sounds . Abdomen: soft . Skin: no rash seen on limited exam . Musculoskeletal: not rigid . Psychiatric:unable to assess . Neurologic: no seizure no involuntary movements         Lab Data:   Basic Metabolic Panel: Recent Labs  Lab 04/11/20 0629 04/11/20 0629 04/12/20 1006 04/13/20 1328 04/14/20 0739 04/15/20 0806 04/16/20 0723 04/16/20 1300 04/17/20 0758  NA 134*  --   --   --   --   --  119* 120* 123*  K 3.8  --   --   --   --   --  4.5 5.1 4.5  CL 98  --   --   --   --   --  81* 82* 89*  CO2 28  --   --   --   --   --  28 28 27   GLUCOSE 102*  --   --   --   --   --  119* 100* 112*  BUN <5*  --   --   --   --   --  7 7 5*  CREATININE <0.30*  --   --   --   --   --  <0.30* <0.30* <0.30*  CALCIUM 9.1  --   --   --   --   --  9.4 9.5 9.2  MG 1.4*   < > 1.5* 1.4* 1.4* 1.4* 1.4*  --   --    < > = values in this interval not displayed.    ABG: Recent Labs  Lab 04/10/20 2122  PHART 7.483*  PCO2ART 39.9  PO2ART 110*  HCO3 29.4*  O2SAT 98.9    Liver Function Tests: Recent Labs  Lab 04/11/20 0629  AST 28  ALT 16   ALKPHOS 127*  BILITOT 1.4*  PROT 6.6  ALBUMIN 1.7*   No results for input(s): LIPASE, AMYLASE in the last 168 hours. No results for input(s): AMMONIA in the last 168 hours.  CBC: Recent Labs  Lab 04/11/20 0629 04/16/20 0723  WBC 20.2* 21.6*  HGB 10.3* 10.4*  HCT 32.2* 32.0*  MCV 91.7 89.9  PLT 261 422*    Cardiac Enzymes: No results for input(s): CKTOTAL, CKMB, CKMBINDEX, TROPONINI in the last 168 hours.  BNP (last 3 results) No results for input(s): BNP in the last 8760 hours.  ProBNP (last 3 results) No results for input(s): PROBNP in the last 8760 hours.  Radiological Exams: No results found.  Assessment/Plan Active Problems:   Acute on chronic respiratory failure with hypoxia (HCC)   Severe sepsis (HCC)   Metabolic encephalopathy  Bilateral pneumonia   Thrombocytopenia (HCC)   1. Acute on chronic respiratory failure hypoxia we will continue with the wean on T collar goal is 8 hours patient seems to be doing well 2. Severe sepsis resolved hemodynamics are stable 3. Metabolic encephalopathy no change continue to follow 4. Bilateral pneumonia treated improving 5. Thrombocytopenia patient is at baseline we will continue to monitor   I have personally seen and evaluated the patient, evaluated laboratory and imaging results, formulated the assessment and plan and placed orders. The Patient requires high complexity decision making with multiple systems involvement.  Rounds were done with the Respiratory Therapy Director and Staff therapists and discussed with nursing staff also.  Yevonne Pax, MD [facility] Pulmonary Critical Care Medicine Sleep Medicine

## 2020-04-18 ENCOUNTER — Other Ambulatory Visit (HOSPITAL_COMMUNITY): Payer: BC Managed Care – PPO

## 2020-04-18 DIAGNOSIS — J189 Pneumonia, unspecified organism: Secondary | ICD-10-CM | POA: Diagnosis not present

## 2020-04-18 DIAGNOSIS — A419 Sepsis, unspecified organism: Secondary | ICD-10-CM | POA: Diagnosis not present

## 2020-04-18 DIAGNOSIS — G9341 Metabolic encephalopathy: Secondary | ICD-10-CM | POA: Diagnosis not present

## 2020-04-18 DIAGNOSIS — J9621 Acute and chronic respiratory failure with hypoxia: Secondary | ICD-10-CM | POA: Diagnosis not present

## 2020-04-18 LAB — URINALYSIS, ROUTINE W REFLEX MICROSCOPIC
Bilirubin Urine: NEGATIVE
Glucose, UA: NEGATIVE mg/dL
Hgb urine dipstick: NEGATIVE
Ketones, ur: NEGATIVE mg/dL
Leukocytes,Ua: NEGATIVE
Nitrite: NEGATIVE
Protein, ur: NEGATIVE mg/dL
Specific Gravity, Urine: 1.014 (ref 1.005–1.030)
pH: 8 (ref 5.0–8.0)

## 2020-04-18 MED ORDER — IOHEXOL 300 MG/ML  SOLN
100.0000 mL | Freq: Once | INTRAMUSCULAR | Status: AC | PRN
  Administered 2020-04-18: 100 mL via INTRAVENOUS

## 2020-04-18 NOTE — Progress Notes (Addendum)
Pulmonary Critical Care Medicine [facility] [place]   PULMONARY CRITICAL CARE SERVICE  PROGRESS NOTE  Date of Service: 04/18/2020  Elizabeth Manning  XBJ:[id number]  DOB: [birth year]   DOA: 04/10/2020  Referring Physician: Carron Curie, MD  HPI: Elizabeth Manning is a 49 y.o. female seen for follow up of Acute on Chronic Respiratory Failure.  Patient is weaning today on aerosol trach collar 28% FiO2 satting well at this time with no fever or distress.  Medications: Reviewed on Rounds  Physical Exam:  Vitals: Pulse 81 respirations 33 BP 136/78 O2 sat 100% temp 96.9  Ventilator Settings ATC 28%  . General: Comfortable at this time . Eyes: Grossly normal lids, irises & conjunctiva . ENT: grossly tongue is normal . Neck: no obvious mass . Cardiovascular: S1 S2 normal no gallop . Respiratory: No rales or rhonchi noted . Abdomen: soft . Skin: no rash seen on limited exam . Musculoskeletal: not rigid . Psychiatric:unable to assess . Neurologic: no seizure no involuntary movements         Lab Data:   Basic Metabolic Panel: Recent Labs  Lab 04/12/20 1006 04/13/20 1328 04/14/20 0739 04/15/20 0806 04/16/20 0723 04/16/20 1300 04/17/20 0758  NA  --   --   --   --  119* 120* 123*  K  --   --   --   --  4.5 5.1 4.5  CL  --   --   --   --  81* 82* 89*  CO2  --   --   --   --  28 28 27   GLUCOSE  --   --   --   --  119* 100* 112*  BUN  --   --   --   --  7 7 5*  CREATININE  --   --   --   --  <0.30* <0.30* <0.30*  CALCIUM  --   --   --   --  9.4 9.5 9.2  MG 1.5* 1.4* 1.4* 1.4* 1.4*  --   --     ABG: No results for input(s): PHART, PCO2ART, PO2ART, HCO3, O2SAT in the last 168 hours.  Liver Function Tests: No results for input(s): AST, ALT, ALKPHOS, BILITOT, PROT, ALBUMIN in the last 168 hours. No results for input(s): LIPASE, AMYLASE in the last 168 hours. No results for input(s): AMMONIA in the last 168 hours.  CBC: Recent Labs  Lab 04/16/20 0723  WBC  21.6*  HGB 10.4*  HCT 32.0*  MCV 89.9  PLT 422*    Cardiac Enzymes: No results for input(s): CKTOTAL, CKMB, CKMBINDEX, TROPONINI in the last 168 hours.  BNP (last 3 results) No results for input(s): BNP in the last 8760 hours.  ProBNP (last 3 results) No results for input(s): PROBNP in the last 8760 hours.  Radiological Exams: CT CHEST W CONTRAST  Result Date: 04/18/2020 CLINICAL DATA:  Right upper quadrant abdominal pain. Positive Murphy sign. Leukocytosis. Sepsis. Acute on chronic respiratory failure. EXAM: CT CHEST, ABDOMEN, AND PELVIS WITH CONTRAST TECHNIQUE: Multidetector CT imaging of the chest, abdomen and pelvis was performed following the standard protocol during bolus administration of intravenous contrast. CONTRAST:  04/20/2020 OMNIPAQUE IOHEXOL 300 MG/ML  SOLN COMPARISON:  One-view abdomen and one-view chest 04/11/2020 FINDINGS: CT CHEST FINDINGS Cardiovascular: The heart size is normal. Small amount pericardial fluid is upper limits of normal. Atherosclerotic calcifications are present the aortic arch and origins the great vessels without aneurysm. Pulmonary arteries are unremarkable. Mediastinum/Nodes:  Tracheostomy tube is noted and in satisfactory position. No significant adenopathy is present. Esophagus is unremarkable. Thoracic inlet is otherwise normal. Lungs/Pleura: Centrilobular emphysematous changes are present. Patchy airspace opacities are noted bilaterally. Two left upper lobe pulmonary nodules measure 8 mm each. Focal thickening is noted along the inferior aspect of the left major fissure. Small effusions are present, right greater than left. There is some nodularity posteriorly on the right. No significant airspace consolidation is present. Airways are patent. Musculoskeletal: Vertebral body heights and alignment are normal. No acute or healing fractures are present. No focal lytic or blastic lesions are present. CT ABDOMEN PELVIS FINDINGS Hepatobiliary: No discrete hepatic  lesions are present. Borderline gallbladder wall thickening is present. No stones are visible. No significant phlegm a tori changes are present about the gallbladder. The common bile duct is within normal limits. Pancreas: Unremarkable. No pancreatic ductal dilatation or surrounding inflammatory changes. Spleen: Normal in size without focal abnormality. Adrenals/Urinary Tract: The adrenal glands are normal bilaterally. Heterogeneous pattern of kidneys on delayed images, left greater than right, suggests infection. No abscess is present. Collecting system opacification is normal bilaterally. The urinary bladder is within normal limits. Stomach/Bowel: Peg tube is in place. Stomach and duodenum are otherwise within normal limits. Small bowel is unremarkable. The appendix is not discretely visualized and may be surgically absent. Diverticular changes are present in the ascending colon without focal inflammation. Transverse colon is normal. Diverticular changes are present within the distal descending and sigmoid colon without focal inflammation. Vascular/Lymphatic: Atherosclerotic calcifications are present in the aorta and branch vessels. Dense calcifications are present at the bifurcation. Lumen is narrowed to at least 50%. No significant adenopathy is present. Reproductive: Heterogeneous appearance of the uterus is noted. Calcified fibroids are present. Uterus and adnexa are otherwise within normal limits. Other: Small amount of free fluid layers dependently in the anatomic pelvis. No discrete collections or abscess is present. No free air is present. No significant ventral hernia is present. Musculoskeletal: Vertebral body heights and alignment are normal. No focal lytic or blastic lesions are present. Bony pelvis is normal. Hips are located and within normal limits. IMPRESSION: 1. Heterogeneous pattern of kidneys on delayed images, left greater than right, suggests infection, pyelonephritis. 2. Small amount of free  fluid layers dependently in the anatomic pelvis. No discrete collections or abscess is present. 3. Borderline gallbladder wall thickening without stones or significant pericholecystic inflammatory changes. Right upper quadrant ultrasound would be useful for further evaluation if clinically indicated. 4. Two left upper lobe pulmonary nodules measure 8 mm each. These are likely inflammatory. Recommend follow-up CT of the chest without contrast following resolution of current symptoms. 5. Small bilateral pleural effusions, right greater than left. 6. Aortic Atherosclerosis (ICD10-I70.0) and Emphysema (ICD10-J43.9). Electronically Signed   By: Marin Roberts M.D.   On: 04/18/2020 04:24   CT ABDOMEN PELVIS W CONTRAST  Result Date: 04/18/2020 CLINICAL DATA:  Right upper quadrant abdominal pain. Positive Murphy sign. Leukocytosis. Sepsis. Acute on chronic respiratory failure. EXAM: CT CHEST, ABDOMEN, AND PELVIS WITH CONTRAST TECHNIQUE: Multidetector CT imaging of the chest, abdomen and pelvis was performed following the standard protocol during bolus administration of intravenous contrast. CONTRAST:  OMNIPAQUE IOHEXOL 300 MG/ML  SOLN COMPARISON:  One-view abdomen and one-view chest 04/11/2020 FINDINGS: CT CHEST FINDINGS Cardiovascular: The heart size is normal. Small amount pericardial fluid is upper limits of normal. Atherosclerotic calcifications are present the aortic arch and origins the great vessels without aneurysm. Pulmonary arteries are unremarkable. Mediastinum/Nodes: Tracheostomy tube  is noted and in satisfactory position. No significant adenopathy is present. Esophagus is unremarkable. Thoracic inlet is otherwise normal. Lungs/Pleura: Centrilobular emphysematous changes are present. Patchy airspace opacities are noted bilaterally. Two left upper lobe pulmonary nodules measure 8 mm each. Focal thickening is noted along the inferior aspect of the left major fissure. Small effusions are present,  right greater than left. There is some nodularity posteriorly on the right. No significant airspace consolidation is present. Airways are patent. Musculoskeletal: Vertebral body heights and alignment are normal. No acute or healing fractures are present. No focal lytic or blastic lesions are present. CT ABDOMEN PELVIS FINDINGS Hepatobiliary: No discrete hepatic lesions are present. Borderline gallbladder wall thickening is present. No stones are visible. No significant phlegm a tori changes are present about the gallbladder. The common bile duct is within normal limits. Pancreas: Unremarkable. No pancreatic ductal dilatation or surrounding inflammatory changes. Spleen: Normal in size without focal abnormality. Adrenals/Urinary Tract: The adrenal glands are normal bilaterally. Heterogeneous pattern of kidneys on delayed images, left greater than right, suggests infection. No abscess is present. Collecting system opacification is normal bilaterally. The urinary bladder is within normal limits. Stomach/Bowel: Peg tube is in place. Stomach and duodenum are otherwise within normal limits. Small bowel is unremarkable. The appendix is not discretely visualized and may be surgically absent. Diverticular changes are present in the ascending colon without focal inflammation. Transverse colon is normal. Diverticular changes are present within the distal descending and sigmoid colon without focal inflammation. Vascular/Lymphatic: Atherosclerotic calcifications are present in the aorta and branch vessels. Dense calcifications are present at the bifurcation. Lumen is narrowed to at least 50%. No significant adenopathy is present. Reproductive: Heterogeneous appearance of the uterus is noted. Calcified fibroids are present. Uterus and adnexa are otherwise within normal limits. Other: Small amount of free fluid layers dependently in the anatomic pelvis. No discrete collections or abscess is present. No free air is present. No  significant ventral hernia is present. Musculoskeletal: Vertebral body heights and alignment are normal. No focal lytic or blastic lesions are present. Bony pelvis is normal. Hips are located and within normal limits. IMPRESSION: 1. Heterogeneous pattern of kidneys on delayed images, left greater than right, suggests infection, pyelonephritis. 2. Small amount of free fluid layers dependently in the anatomic pelvis. No discrete collections or abscess is present. 3. Borderline gallbladder wall thickening without stones or significant pericholecystic inflammatory changes. Right upper quadrant ultrasound would be useful for further evaluation if clinically indicated. 4. Two left upper lobe pulmonary nodules measure 8 mm each. These are likely inflammatory. Recommend follow-up CT of the chest without contrast following resolution of current symptoms. 5. Small bilateral pleural effusions, right greater than left. 6. Aortic Atherosclerosis (ICD10-I70.0) and Emphysema (ICD10-J43.9). Electronically Signed   By: San Morelle M.D.   On: 04/18/2020 04:24    Assessment/Plan Active Problems:   Acute on chronic respiratory failure with hypoxia (HCC)   Severe sepsis (HCC)   Metabolic encephalopathy   Bilateral pneumonia   Thrombocytopenia ([place])   1. Acute on chronic respiratory failure hypoxia patient is doing well with weaning to aerosol trach collar today on 20% FiO2.  We will continue aggressive pulmonary toilet supportive measures.  Continue to attempt weaning as tolerated. 2. Severe sepsis resolved hemodynamics are stable 3. Metabolic encephalopathy no change continue to follow 4. Bilateral pneumonia treated improving 5. Thrombocytopenia patient is at baseline we will continue to monitor   I have personally seen and evaluated the patient, evaluated laboratory and imaging results,  formulated the assessment and plan and placed orders. The Patient requires high complexity decision making with multiple  systems involvement.  Rounds were done with the Respiratory Therapy Director and Staff therapists and discussed with nursing staff also.  Yevonne Pax, MD [facility] Pulmonary Critical Care Medicine Sleep Medicine

## 2020-04-19 ENCOUNTER — Other Ambulatory Visit (HOSPITAL_COMMUNITY): Payer: BC Managed Care – PPO

## 2020-04-19 DIAGNOSIS — J9621 Acute and chronic respiratory failure with hypoxia: Secondary | ICD-10-CM | POA: Diagnosis not present

## 2020-04-19 DIAGNOSIS — G9341 Metabolic encephalopathy: Secondary | ICD-10-CM | POA: Diagnosis not present

## 2020-04-19 DIAGNOSIS — J189 Pneumonia, unspecified organism: Secondary | ICD-10-CM | POA: Diagnosis not present

## 2020-04-19 DIAGNOSIS — A419 Sepsis, unspecified organism: Secondary | ICD-10-CM | POA: Diagnosis not present

## 2020-04-19 LAB — CBC
HCT: 28.7 % — ABNORMAL LOW (ref 36.0–46.0)
Hemoglobin: 9.7 g/dL — ABNORMAL LOW (ref 12.0–15.0)
MCH: 29.8 pg (ref 26.0–34.0)
MCHC: 33.8 g/dL (ref 30.0–36.0)
MCV: 88 fL (ref 80.0–100.0)
Platelets: 462 10*3/uL — ABNORMAL HIGH (ref 150–400)
RBC: 3.26 MIL/uL — ABNORMAL LOW (ref 3.87–5.11)
RDW: 17.3 % — ABNORMAL HIGH (ref 11.5–15.5)
WBC: 16.8 10*3/uL — ABNORMAL HIGH (ref 4.0–10.5)
nRBC: 0 % (ref 0.0–0.2)

## 2020-04-19 LAB — BASIC METABOLIC PANEL
Anion gap: 9 (ref 5–15)
BUN: <5 mg/dL — ABNORMAL LOW (ref 6–20)
CO2: 28 mmol/L (ref 22–32)
Calcium: 9.2 mg/dL (ref 8.9–10.3)
Chloride: 84 mmol/L — ABNORMAL LOW (ref 98–111)
Creatinine, Ser: <0.3 mg/dL — ABNORMAL LOW (ref 0.44–1.00)
Glucose, Bld: 85 mg/dL (ref 70–99)
Potassium: 4.3 mmol/L (ref 3.5–5.1)
Sodium: 121 mmol/L — ABNORMAL LOW (ref 135–145)

## 2020-04-19 NOTE — Progress Notes (Signed)
Pulmonary Critical Care Medicine [facility] [place]   PULMONARY CRITICAL CARE SERVICE  PROGRESS NOTE  Date of Service: 04/19/2020  Elizabeth Manning  [id number]:[id number]  DOB: 10/19/1971   DOA: 04/10/2020  Referring Physician: Carron Curie, MD  HPI: Elizabeth Manning is a 49 y.o. female seen for follow up of Acute on Chronic Respiratory Failure.  Patient at this time was on the ventilator as did about 12 hours yesterday on T collar and will be started back on the wean today  Medications: Reviewed on Rounds  Physical Exam:  Vitals: Temperature is 96.9 pulse 105 respiratory 34 blood pressure is 124/67 saturations 98%  Ventilator Settings on pressure assist control FiO2 28% tidal volume 300 PEEP 5 IP 18  . General: Comfortable at this time . Eyes: Grossly normal lids, irises & conjunctiva . ENT: grossly tongue is normal . Neck: no obvious mass . Cardiovascular: S1 S2 normal no gallop . Respiratory: No rhonchi coarse breath sounds . Abdomen: soft . Skin: no rash seen on limited exam . Musculoskeletal: not rigid . Psychiatric:unable to assess . Neurologic: no seizure no involuntary movements         Lab Data:   Basic Metabolic Panel: Recent Labs  Lab 04/13/20 1328 04/14/20 0739 04/15/20 0806 04/16/20 0723 04/16/20 1300 04/17/20 0758  NA  --   --   --  119* 120* 123*  K  --   --   --  4.5 5.1 4.5  CL  --   --   --  81* 82* 89*  CO2  --   --   --  28 28 27   GLUCOSE  --   --   --  119* 100* 112*  BUN  --   --   --  7 7 5*  CREATININE  --   --   --  <0.30* <0.30* <0.30*  CALCIUM  --   --   --  9.4 9.5 9.2  MG 1.4* 1.4* 1.4* 1.4*  --   --     ABG: No results for input(s): PHART, PCO2ART, PO2ART, HCO3, O2SAT in the last 168 hours.  Liver Function Tests: No results for input(s): AST, ALT, ALKPHOS, BILITOT, PROT, ALBUMIN in the last 168 hours. No results for input(s): LIPASE, AMYLASE in the last 168 hours. No results for input(s): AMMONIA in the last 168  hours.  CBC: Recent Labs  Lab 04/16/20 0723  WBC 21.6*  HGB 10.4*  HCT 32.0*  MCV 89.9  PLT 422*    Cardiac Enzymes: No results for input(s): CKTOTAL, CKMB, CKMBINDEX, TROPONINI in the last 168 hours.  BNP (last 3 results) No results for input(s): BNP in the last 8760 hours.  ProBNP (last 3 results) No results for input(s): PROBNP in the last 8760 hours.  Radiological Exams: CT CHEST W CONTRAST  Result Date: 04/18/2020 CLINICAL DATA:  Right upper quadrant abdominal pain. Positive Murphy sign. Leukocytosis. Sepsis. Acute on chronic respiratory failure. EXAM: CT CHEST, ABDOMEN, AND PELVIS WITH CONTRAST TECHNIQUE: Multidetector CT imaging of the chest, abdomen and pelvis was performed following the standard protocol during bolus administration of intravenous contrast. CONTRAST:  04/20/2020 OMNIPAQUE IOHEXOL 300 MG/ML  SOLN COMPARISON:  One-view abdomen and one-view chest 04/11/2020 FINDINGS: CT CHEST FINDINGS Cardiovascular: The heart size is normal. Small amount pericardial fluid is upper limits of normal. Atherosclerotic calcifications are present the aortic arch and origins the great vessels without aneurysm. Pulmonary arteries are unremarkable. Mediastinum/Nodes: Tracheostomy tube is noted and in satisfactory position.  No significant adenopathy is present. Esophagus is unremarkable. Thoracic inlet is otherwise normal. Lungs/Pleura: Centrilobular emphysematous changes are present. Patchy airspace opacities are noted bilaterally. Two left upper lobe pulmonary nodules measure 8 mm each. Focal thickening is noted along the inferior aspect of the left major fissure. Small effusions are present, right greater than left. There is some nodularity posteriorly on the right. No significant airspace consolidation is present. Airways are patent. Musculoskeletal: Vertebral body heights and alignment are normal. No acute or healing fractures are present. No focal lytic or blastic lesions are present. CT  ABDOMEN PELVIS FINDINGS Hepatobiliary: No discrete hepatic lesions are present. Borderline gallbladder wall thickening is present. No stones are visible. No significant phlegm a tori changes are present about the gallbladder. The common bile duct is within normal limits. Pancreas: Unremarkable. No pancreatic ductal dilatation or surrounding inflammatory changes. Spleen: Normal in size without focal abnormality. Adrenals/Urinary Tract: The adrenal glands are normal bilaterally. Heterogeneous pattern of kidneys on delayed images, left greater than right, suggests infection. No abscess is present. Collecting system opacification is normal bilaterally. The urinary bladder is within normal limits. Stomach/Bowel: Peg tube is in place. Stomach and duodenum are otherwise within normal limits. Small bowel is unremarkable. The appendix is not discretely visualized and may be surgically absent. Diverticular changes are present in the ascending colon without focal inflammation. Transverse colon is normal. Diverticular changes are present within the distal descending and sigmoid colon without focal inflammation. Vascular/Lymphatic: Atherosclerotic calcifications are present in the aorta and branch vessels. Dense calcifications are present at the bifurcation. Lumen is narrowed to at least 50%. No significant adenopathy is present. Reproductive: Heterogeneous appearance of the uterus is noted. Calcified fibroids are present. Uterus and adnexa are otherwise within normal limits. Other: Small amount of free fluid layers dependently in the anatomic pelvis. No discrete collections or abscess is present. No free air is present. No significant ventral hernia is present. Musculoskeletal: Vertebral body heights and alignment are normal. No focal lytic or blastic lesions are present. Bony pelvis is normal. Hips are located and within normal limits. IMPRESSION: 1. Heterogeneous pattern of kidneys on delayed images, left greater than right,  suggests infection, pyelonephritis. 2. Small amount of free fluid layers dependently in the anatomic pelvis. No discrete collections or abscess is present. 3. Borderline gallbladder wall thickening without stones or significant pericholecystic inflammatory changes. Right upper quadrant ultrasound would be useful for further evaluation if clinically indicated. 4. Two left upper lobe pulmonary nodules measure 8 mm each. These are likely inflammatory. Recommend follow-up CT of the chest without contrast following resolution of current symptoms. 5. Small bilateral pleural effusions, right greater than left. 6. Aortic Atherosclerosis (ICD10-I70.0) and Emphysema (ICD10-J43.9). Electronically Signed   By: Marin Roberts M.D.   On: 04/18/2020 04:24   CT ABDOMEN PELVIS W CONTRAST  Result Date: 04/18/2020 CLINICAL DATA:  Right upper quadrant abdominal pain. Positive Murphy sign. Leukocytosis. Sepsis. Acute on chronic respiratory failure. EXAM: CT CHEST, ABDOMEN, AND PELVIS WITH CONTRAST TECHNIQUE: Multidetector CT imaging of the chest, abdomen and pelvis was performed following the standard protocol during bolus administration of intravenous contrast. CONTRAST:  OMNIPAQUE IOHEXOL 300 MG/ML  SOLN COMPARISON:  One-view abdomen and one-view chest 04/11/2020 FINDINGS: CT CHEST FINDINGS Cardiovascular: The heart size is normal. Small amount pericardial fluid is upper limits of normal. Atherosclerotic calcifications are present the aortic arch and origins the great vessels without aneurysm. Pulmonary arteries are unremarkable. Mediastinum/Nodes: Tracheostomy tube is noted and in satisfactory position. No significant  adenopathy is present. Esophagus is unremarkable. Thoracic inlet is otherwise normal. Lungs/Pleura: Centrilobular emphysematous changes are present. Patchy airspace opacities are noted bilaterally. Two left upper lobe pulmonary nodules measure 8 mm each. Focal thickening is noted along the inferior  aspect of the left major fissure. Small effusions are present, right greater than left. There is some nodularity posteriorly on the right. No significant airspace consolidation is present. Airways are patent. Musculoskeletal: Vertebral body heights and alignment are normal. No acute or healing fractures are present. No focal lytic or blastic lesions are present. CT ABDOMEN PELVIS FINDINGS Hepatobiliary: No discrete hepatic lesions are present. Borderline gallbladder wall thickening is present. No stones are visible. No significant phlegm a tori changes are present about the gallbladder. The common bile duct is within normal limits. Pancreas: Unremarkable. No pancreatic ductal dilatation or surrounding inflammatory changes. Spleen: Normal in size without focal abnormality. Adrenals/Urinary Tract: The adrenal glands are normal bilaterally. Heterogeneous pattern of kidneys on delayed images, left greater than right, suggests infection. No abscess is present. Collecting system opacification is normal bilaterally. The urinary bladder is within normal limits. Stomach/Bowel: Peg tube is in place. Stomach and duodenum are otherwise within normal limits. Small bowel is unremarkable. The appendix is not discretely visualized and may be surgically absent. Diverticular changes are present in the ascending colon without focal inflammation. Transverse colon is normal. Diverticular changes are present within the distal descending and sigmoid colon without focal inflammation. Vascular/Lymphatic: Atherosclerotic calcifications are present in the aorta and branch vessels. Dense calcifications are present at the bifurcation. Lumen is narrowed to at least 50%. No significant adenopathy is present. Reproductive: Heterogeneous appearance of the uterus is noted. Calcified fibroids are present. Uterus and adnexa are otherwise within normal limits. Other: Small amount of free fluid layers dependently in the anatomic pelvis. No discrete  collections or abscess is present. No free air is present. No significant ventral hernia is present. Musculoskeletal: Vertebral body heights and alignment are normal. No focal lytic or blastic lesions are present. Bony pelvis is normal. Hips are located and within normal limits. IMPRESSION: 1. Heterogeneous pattern of kidneys on delayed images, left greater than right, suggests infection, pyelonephritis. 2. Small amount of free fluid layers dependently in the anatomic pelvis. No discrete collections or abscess is present. 3. Borderline gallbladder wall thickening without stones or significant pericholecystic inflammatory changes. Right upper quadrant ultrasound would be useful for further evaluation if clinically indicated. 4. Two left upper lobe pulmonary nodules measure 8 mm each. These are likely inflammatory. Recommend follow-up CT of the chest without contrast following resolution of current symptoms. 5. Small bilateral pleural effusions, right greater than left. 6. Aortic Atherosclerosis (ICD10-I70.0) and Emphysema (ICD10-J43.9). Electronically Signed   By: San Morelle M.D.   On: 04/18/2020 04:24    Assessment/Plan Active Problems:   Acute on chronic respiratory failure with hypoxia (HCC)   Severe sepsis (HCC)   Metabolic encephalopathy   Bilateral pneumonia   Thrombocytopenia ([place])   1. Acute on chronic respiratory failure hypoxia we will try to continue to advance the weaning did 12 hours yesterday we will advance further today 2. Severe sepsis resolved we will continue with supportive care 3. Metabolic encephalopathy no change with supportive care 4. Bilateral pneumonia treated clinically improved 5. Thrombocytopenia follow blood   I have personally seen and evaluated the patient, evaluated laboratory and imaging results, formulated the assessment and plan and placed orders. The Patient requires high complexity decision making with multiple systems involvement.  Rounds were  done with the Respiratory Therapy Director and Staff therapists and discussed with nursing staff also.  Allyne Gee, MD [facility] Pulmonary Critical Care Medicine Sleep Medicine

## 2020-04-20 DIAGNOSIS — A419 Sepsis, unspecified organism: Secondary | ICD-10-CM | POA: Diagnosis not present

## 2020-04-20 DIAGNOSIS — J9621 Acute and chronic respiratory failure with hypoxia: Secondary | ICD-10-CM | POA: Diagnosis not present

## 2020-04-20 DIAGNOSIS — G9341 Metabolic encephalopathy: Secondary | ICD-10-CM | POA: Diagnosis not present

## 2020-04-20 DIAGNOSIS — J189 Pneumonia, unspecified organism: Secondary | ICD-10-CM | POA: Diagnosis not present

## 2020-04-20 LAB — URINE CULTURE: Culture: >=100000 — AB

## 2020-04-20 NOTE — Progress Notes (Signed)
Pulmonary Critical Care Medicine [place]   PULMONARY CRITICAL CARE SERVICE  PROGRESS NOTE  Date of Service: 04/20/2020  Elizabeth Manning  [id number]:[id number]  DOB: 1971-01-31   DOA: 04/10/2020  Referring Physician: Merton Border, MD  HPI: Elizabeth Manning is a 49 y.o. female seen for follow up of Acute on Chronic Respiratory Failure.  Patient at this time is on T collar has been on the wean the goal is for 20 hours  Medications: Reviewed on Rounds  Physical Exam:  Vitals: Temperature is 97.2 pulse 85 respiratory rate 27 blood pressure is 168/79 saturations 97%  Ventilator Settings on T collar goal of 20 hours  . General: Comfortable at this time . Eyes: Grossly normal lids, irises & conjunctiva . ENT: grossly tongue is normal . Neck: no obvious mass . Cardiovascular: S1 S2 normal no gallop . Respiratory: No rhonchi no rales are noted at this time . Abdomen: soft . Skin: no rash seen on limited exam . Musculoskeletal: not rigid . Psychiatric:unable to assess . Neurologic: no seizure no involuntary movements         Lab Data:   Basic Metabolic Panel: Recent Labs  Lab 04/14/20 0739 04/15/20 0806 04/16/20 0723 04/16/20 1300 04/17/20 0758 04/19/20 0943  NA  --   --  119* 120* 123* 121*  K  --   --  4.5 5.1 4.5 4.3  CL  --   --  81* 82* 89* 84*  CO2  --   --  28 28 27 28   GLUCOSE  --   --  119* 100* 112* 85  BUN  --   --  7 7 5* <5*  CREATININE  --   --  <0.30* <0.30* <0.30* <0.30*  CALCIUM  --   --  9.4 9.5 9.2 9.2  MG 1.4* 1.4* 1.4*  --   --   --     ABG: No results for input(s): PHART, PCO2ART, PO2ART, HCO3, O2SAT in the last 168 hours.  Liver Function Tests: No results for input(s): AST, ALT, ALKPHOS, BILITOT, PROT, ALBUMIN in the last 168 hours. No results for input(s): LIPASE, AMYLASE in the last 168 hours. No results for input(s): AMMONIA in the last 168 hours.  CBC: Recent Labs  Lab 04/16/20 0723 04/19/20 0943  WBC 21.6* 16.8*   HGB 10.4* 9.7*  HCT 32.0* 28.7*  MCV 89.9 88.0  PLT 422* 462*    Cardiac Enzymes: No results for input(s): CKTOTAL, CKMB, CKMBINDEX, TROPONINI in the last 168 hours.  BNP (last 3 results) No results for input(s): BNP in the last 8760 hours.  ProBNP (last 3 results) No results for input(s): PROBNP in the last 8760 hours.  Radiological Exams: US Abdomen Limited RUQ  Result Date: 04/19/2020 CLINICAL DATA:  Right upper quadrant pain EXAM: ULTRASOUND ABDOMEN LIMITED RIGHT UPPER QUADRANT COMPARISON:  CT from the previous day FINDINGS: Gallbladder: No gallstones or wall thickening visualized. No sonographic Murphy sign noted by sonographer. Common bile duct: Diameter: 5 mm Liver: No focal lesion identified. Within normal limits in parenchymal echogenicity. Portal vein is patent on color Doppler imaging with normal direction of blood flow towards the liver. Other: None. IMPRESSION: No acute abnormality noted. Electronically Signed   By: Inez Catalina M.D.   On: 04/19/2020 12:16    Assessment/Plan Active Problems:   Acute on chronic respiratory failure with hypoxia (HCC)   Severe sepsis (HCC)   Metabolic encephalopathy   Bilateral pneumonia   Thrombocytopenia ([place])  1. Acute on chronic respiratory failure with hypoxia patient is weaning doing fairly well plan is going to be to continue to advance the wean on T collar as tolerated.  Will continue secretion management supportive care. 2. Severe sepsis resolved hemodynamics are stable. 3. Metabolic encephalopathy grossly unchanged 4. Bilateral pneumonia treated we will continue with supportive care 5. Thrombocytopenia following labs   I have personally seen and evaluated the patient, evaluated laboratory and imaging results, formulated the assessment and plan and placed orders. The Patient requires high complexity decision making with multiple systems involvement.  Rounds were done with the Respiratory Therapy Director and Staff  therapists and discussed with nursing staff also.  Yevonne Pax, MD [employer] Pulmonary Critical Care Medicine Sleep Medicine

## 2020-04-21 DIAGNOSIS — A419 Sepsis, unspecified organism: Secondary | ICD-10-CM | POA: Diagnosis not present

## 2020-04-21 DIAGNOSIS — J189 Pneumonia, unspecified organism: Secondary | ICD-10-CM | POA: Diagnosis not present

## 2020-04-21 DIAGNOSIS — G9341 Metabolic encephalopathy: Secondary | ICD-10-CM | POA: Diagnosis not present

## 2020-04-21 DIAGNOSIS — J9621 Acute and chronic respiratory failure with hypoxia: Secondary | ICD-10-CM | POA: Diagnosis not present

## 2020-04-21 LAB — CULTURE, RESPIRATORY W GRAM STAIN

## 2020-04-21 LAB — BASIC METABOLIC PANEL
Anion gap: 9 (ref 5–15)
BUN: 5 mg/dL — ABNORMAL LOW (ref 6–20)
CO2: 25 mmol/L (ref 22–32)
Calcium: 9.5 mg/dL (ref 8.9–10.3)
Chloride: 96 mmol/L — ABNORMAL LOW (ref 98–111)
Creatinine, Ser: <0.3 mg/dL — ABNORMAL LOW (ref 0.44–1.00)
Glucose, Bld: 120 mg/dL — ABNORMAL HIGH (ref 70–99)
Potassium: 3.9 mmol/L (ref 3.5–5.1)
Sodium: 130 mmol/L — ABNORMAL LOW (ref 135–145)

## 2020-04-21 NOTE — Progress Notes (Signed)
Pulmonary Critical Care Medicine [place]   PULMONARY CRITICAL CARE SERVICE  PROGRESS NOTE  Date of Service: 04/21/2020  Elizabeth Manning  OVZ:[id number]  DOB: 1971-08-24   DOA: 04/10/2020  Referring Physician: Merton Border, MD  HPI: Elizabeth Manning is a 49 y.o. female seen for follow up of Acute on Chronic Respiratory Failure.  Patient was able to do about 18 hours of T collar yesterday right now is resting on pressure control will be resuming the wean on T collar  Medications: Reviewed on Rounds  Physical Exam:  Vitals: Temperature is 96.9 pulse 93 respiratory rate 30 blood pressure is 112/68 saturations 97%  Ventilator Settings on pressure assist control FiO2 30% tidal line 367 PEEP 5 IP 18  . General: Comfortable at this time . Eyes: Grossly normal lids, irises & conjunctiva . ENT: grossly tongue is normal . Neck: no obvious mass . Cardiovascular: S1 S2 normal no gallop . Respiratory: No rhonchi coarse breath sounds are noted . Abdomen: soft . Skin: no rash seen on limited exam . Musculoskeletal: not rigid . Psychiatric:unable to assess . Neurologic: no seizure no involuntary movements         Lab Data:   Basic Metabolic Panel: Recent Labs  Lab 04/15/20 0806 04/16/20 0723 04/16/20 1300 04/17/20 0758 04/19/20 0943  NA  --  119* 120* 123* 121*  K  --  4.5 5.1 4.5 4.3  CL  --  81* 82* 89* 84*  CO2  --  28 28 27 28   GLUCOSE  --  119* 100* 112* 85  BUN  --  7 7 5* <5*  CREATININE  --  <0.30* <0.30* <0.30* <0.30*  CALCIUM  --  9.4 9.5 9.2 9.2  MG 1.4* 1.4*  --   --   --     ABG: No results for input(s): PHART, PCO2ART, PO2ART, HCO3, O2SAT in the last 168 hours.  Liver Function Tests: No results for input(s): AST, ALT, ALKPHOS, BILITOT, PROT, ALBUMIN in the last 168 hours. No results for input(s): LIPASE, AMYLASE in the last 168 hours. No results for input(s): AMMONIA in the last 168 hours.  CBC: Recent Labs  Lab 04/16/20 0723  04/19/20 0943  WBC 21.6* 16.8*  HGB 10.4* 9.7*  HCT 32.0* 28.7*  MCV 89.9 88.0  PLT 422* 462*    Cardiac Enzymes: No results for input(s): CKTOTAL, CKMB, CKMBINDEX, TROPONINI in the last 168 hours.  BNP (last 3 results) No results for input(s): BNP in the last 8760 hours.  ProBNP (last 3 results) No results for input(s): PROBNP in the last 8760 hours.  Radiological Exams: US Abdomen Limited RUQ  Result Date: 04/19/2020 CLINICAL DATA:  Right upper quadrant pain EXAM: ULTRASOUND ABDOMEN LIMITED RIGHT UPPER QUADRANT COMPARISON:  CT from the previous day FINDINGS: Gallbladder: No gallstones or wall thickening visualized. No sonographic Murphy sign noted by sonographer. Common bile duct: Diameter: 5 mm Liver: No focal lesion identified. Within normal limits in parenchymal echogenicity. Portal vein is patent on color Doppler imaging with normal direction of blood flow towards the liver. Other: None. IMPRESSION: No acute abnormality noted. Electronically Signed   By: Inez Catalina M.D.   On: 04/19/2020 12:16    Assessment/Plan Active Problems:   Acute on chronic respiratory failure with hypoxia (HCC)   Severe sepsis (HCC)   Metabolic encephalopathy   Bilateral pneumonia   Thrombocytopenia ([place])   1. Acute on chronic respiratory failure hypoxia we will continue to wean on T collar  as tolerated titrate oxygen as tolerated 2. Severe sepsis resolved 3. Metabolic encephalopathy at baseline 4. Bilateral pneumonia treated clinically improved 5. Thrombocytopenia patient is at baseline we will continue supportive care   I have personally seen and evaluated the patient, evaluated laboratory and imaging results, formulated the assessment and plan and placed orders. The Patient requires high complexity decision making with multiple systems involvement.  Rounds were done with the Respiratory Therapy Director and Staff therapists and discussed with nursing staff also.  Yevonne Pax, MD  [employer] Pulmonary Critical Care Medicine Sleep Medicine

## 2020-04-22 DIAGNOSIS — A419 Sepsis, unspecified organism: Secondary | ICD-10-CM | POA: Diagnosis not present

## 2020-04-22 DIAGNOSIS — J9621 Acute and chronic respiratory failure with hypoxia: Secondary | ICD-10-CM | POA: Diagnosis not present

## 2020-04-22 DIAGNOSIS — J189 Pneumonia, unspecified organism: Secondary | ICD-10-CM | POA: Diagnosis not present

## 2020-04-22 DIAGNOSIS — G9341 Metabolic encephalopathy: Secondary | ICD-10-CM | POA: Diagnosis not present

## 2020-04-22 LAB — BASIC METABOLIC PANEL
Anion gap: 8 (ref 5–15)
BUN: 5 mg/dL — ABNORMAL LOW (ref 6–20)
CO2: 26 mmol/L (ref 22–32)
Calcium: 9.3 mg/dL (ref 8.9–10.3)
Chloride: 97 mmol/L — ABNORMAL LOW (ref 98–111)
Creatinine, Ser: <0.3 mg/dL — ABNORMAL LOW (ref 0.44–1.00)
Glucose, Bld: 94 mg/dL (ref 70–99)
Potassium: 4.3 mmol/L (ref 3.5–5.1)
Sodium: 131 mmol/L — ABNORMAL LOW (ref 135–145)

## 2020-04-22 NOTE — Progress Notes (Signed)
Pulmonary Critical Care Medicine [facility] [place]   PULMONARY CRITICAL CARE SERVICE  PROGRESS NOTE  Date of Service: 04/22/2020  Elizabeth Manning  ELF:[id number]  DOB: [birth year]   DOA: 04/10/2020  Referring Physician: Carron Curie, MD  HPI: Elizabeth Manning is a 49 y.o. female seen for follow up of Acute on Chronic Respiratory Failure.  Patient currently is on T collar has been on 20% FiO2 today will be more than 24 hours  Medications: Reviewed on Rounds  Physical Exam:  Vitals: Temperature is 97.1 pulse 88 respiratory rate 40 blood pressure is 135/63 saturations 97%  Ventilator Settings off ventilator on T collar FiO2 28%   General: Comfortable at this time  Eyes: Grossly normal lids, irises & conjunctiva  ENT: grossly tongue is normal  Neck: no obvious mass  Cardiovascular: S1 S2 normal no gallop  Respiratory: No rhonchi no rales are noted at this time  Abdomen: soft  Skin: no rash seen on limited exam  Musculoskeletal: not rigid  Psychiatric:unable to assess  Neurologic: no seizure no involuntary movements         Lab Data:   Basic Metabolic Panel: Recent Labs  Lab 04/16/20 0723 04/16/20 0723 04/16/20 1300 04/17/20 0758 04/19/20 0943 04/21/20 1031 04/22/20 0617  NA 119*   < > 120* 123* 121* 130* 131*  K 4.5   < > 5.1 4.5 4.3 3.9 4.3  CL 81*   < > 82* 89* 84* 96* 97*  CO2 28   < > 28 27 28 25 26   GLUCOSE 119*   < > 100* 112* 85 120* 94  BUN 7   < > 7 5* <5* 5* 5*  CREATININE <0.30*   < > <0.30* <0.30* <0.30* <0.30* <0.30*  CALCIUM 9.4   < > 9.5 9.2 9.2 9.5 9.3  MG 1.4*  --   --   --   --   --   --    < > = values in this interval not displayed.    ABG: No results for input(s): PHART, PCO2ART, PO2ART, HCO3, O2SAT in the last 168 hours.  Liver Function Tests: No results for input(s): AST, ALT, ALKPHOS, BILITOT, PROT, ALBUMIN in the last 168 hours. No results for input(s): LIPASE, AMYLASE in the last 168 hours. No results for  input(s): AMMONIA in the last 168 hours.  CBC: Recent Labs  Lab 04/16/20 0723 04/19/20 0943  WBC 21.6* 16.8*  HGB 10.4* 9.7*  HCT 32.0* 28.7*  MCV 89.9 88.0  PLT 422* 462*    Cardiac Enzymes: No results for input(s): CKTOTAL, CKMB, CKMBINDEX, TROPONINI in the last 168 hours.  BNP (last 3 results) No results for input(s): BNP in the last 8760 hours.  ProBNP (last 3 results) No results for input(s): PROBNP in the last 8760 hours.  Radiological Exams: No results found.  Assessment/Plan Active Problems:   Acute on chronic respiratory failure with hypoxia (HCC)   Severe sepsis (HCC)   Metabolic encephalopathy   Bilateral pneumonia   Thrombocytopenia (HCC)   1. Acute on chronic respiratory failure hypoxia patient had increased respiratory rate which is now settled down patient will be completing within 24 hours off the ventilator we will continue with supportive care 2. Severe sepsis resolved patient is being seen by ID 3. Metabolic encephalopathy no change supportive care 4. Bilateral pneumonia follow-up x-ray as needed 5. Thrombocytopenia labs have been stable   I have personally seen and evaluated the patient, evaluated laboratory and imaging results, formulated  the assessment and plan and placed orders. The Patient requires high complexity decision making with multiple systems involvement.  Rounds were done with the Respiratory Therapy Director and Staff therapists and discussed with nursing staff also.  Allyne Gee, MD [facility] Pulmonary Critical Care Medicine Sleep Medicine

## 2020-04-23 DIAGNOSIS — J9621 Acute and chronic respiratory failure with hypoxia: Secondary | ICD-10-CM | POA: Diagnosis not present

## 2020-04-23 DIAGNOSIS — A419 Sepsis, unspecified organism: Secondary | ICD-10-CM | POA: Diagnosis not present

## 2020-04-23 DIAGNOSIS — G9341 Metabolic encephalopathy: Secondary | ICD-10-CM | POA: Diagnosis not present

## 2020-04-23 DIAGNOSIS — J189 Pneumonia, unspecified organism: Secondary | ICD-10-CM | POA: Diagnosis not present

## 2020-04-23 LAB — BASIC METABOLIC PANEL
Anion gap: 8 (ref 5–15)
BUN: 7 mg/dL (ref 6–20)
CO2: 26 mmol/L (ref 22–32)
Calcium: 9.3 mg/dL (ref 8.9–10.3)
Chloride: 89 mmol/L — ABNORMAL LOW (ref 98–111)
Creatinine, Ser: <0.3 mg/dL — ABNORMAL LOW (ref 0.44–1.00)
Glucose, Bld: 118 mg/dL — ABNORMAL HIGH (ref 70–99)
Potassium: 4.9 mmol/L (ref 3.5–5.1)
Sodium: 123 mmol/L — ABNORMAL LOW (ref 135–145)

## 2020-04-23 LAB — CBC
HCT: 29.3 % — ABNORMAL LOW (ref 36.0–46.0)
Hemoglobin: 9.6 g/dL — ABNORMAL LOW (ref 12.0–15.0)
MCH: 29.7 pg (ref 26.0–34.0)
MCHC: 32.8 g/dL (ref 30.0–36.0)
MCV: 90.7 fL (ref 80.0–100.0)
Platelets: 438 10*3/uL — ABNORMAL HIGH (ref 150–400)
RBC: 3.23 MIL/uL — ABNORMAL LOW (ref 3.87–5.11)
RDW: 17.1 % — ABNORMAL HIGH (ref 11.5–15.5)
WBC: 11.5 10*3/uL — ABNORMAL HIGH (ref 4.0–10.5)
nRBC: 0 % (ref 0.0–0.2)

## 2020-04-23 NOTE — Progress Notes (Addendum)
Pulmonary Critical Care Medicine [facility] [place]   PULMONARY CRITICAL CARE SERVICE  PROGRESS NOTE  Date of Service: 04/23/2020  Elizabeth Manning  [id number]:[id number]  DOB: [birth year]   DOA: 04/10/2020  Referring Physician: Carron Curie, MD  HPI: Elizabeth Manning is a 49 y.o. female seen for follow up of Acute on Chronic Respiratory Failure.  Patient is now been 48 hours off the ventilator currently on 20% her trach collar satting well with no distress.  Medications: Reviewed on Rounds  Physical Exam:  Vitals: Pulse 83 respirations 40 BP 130/72 O2 sat 100% temp 96.7  Ventilator Settings ATC 28%  . General: Comfortable at this time . Eyes: Grossly normal lids, irises & conjunctiva . ENT: grossly tongue is normal . Neck: no obvious mass . Cardiovascular: S1 S2 normal no gallop . Respiratory: No rales or rhonchi noted . Abdomen: soft . Skin: no rash seen on limited exam . Musculoskeletal: not rigid . Psychiatric:unable to assess . Neurologic: no seizure no involuntary movements         Lab Data:   Basic Metabolic Panel: Recent Labs  Lab 04/17/20 0758 04/19/20 0943 04/21/20 1031 04/22/20 0617 04/23/20 0300  NA 123* 121* 130* 131* 123*  K 4.5 4.3 3.9 4.3 4.9  CL 89* 84* 96* 97* 89*  CO2 27 28 25 26 26   GLUCOSE 112* 85 120* 94 118*  BUN 5* <5* 5* 5* 7  CREATININE <0.30* <0.30* <0.30* <0.30* <0.30*  CALCIUM 9.2 9.2 9.5 9.3 9.3    ABG: No results for input(s): PHART, PCO2ART, PO2ART, HCO3, O2SAT in the last 168 hours.  Liver Function Tests: No results for input(s): AST, ALT, ALKPHOS, BILITOT, PROT, ALBUMIN in the last 168 hours. No results for input(s): LIPASE, AMYLASE in the last 168 hours. No results for input(s): AMMONIA in the last 168 hours.  CBC: Recent Labs  Lab 04/19/20 0943 04/23/20 0300  WBC 16.8* 11.5*  HGB 9.7* 9.6*  HCT 28.7* 29.3*  MCV 88.0 90.7  PLT 462* 438*    Cardiac Enzymes: No results for input(s): CKTOTAL, CKMB,  CKMBINDEX, TROPONINI in the last 168 hours.  BNP (last 3 results) No results for input(s): BNP in the last 8760 hours.  ProBNP (last 3 results) No results for input(s): PROBNP in the last 8760 hours.  Radiological Exams: No results found.  Assessment/Plan Active Problems:   Acute on chronic respiratory failure with hypoxia (HCC)   Severe sepsis (HCC)   Metabolic encephalopathy   Bilateral pneumonia   Thrombocytopenia (HCC)   1. Acute on chronic respiratory failure hypoxia patient had increased respiratory rate which is now settled down patient will be completing within 48 hours off the ventilator we will continue with supportive care 2. Severe sepsis resolved patient is being seen by ID 3. Metabolic encephalopathy no change supportive care 4. Bilateral pneumonia follow-up x-ray as needed 5. Thrombocytopenia labs have been stable   I have personally seen and evaluated the patient, evaluated laboratory and imaging results, formulated the assessment and plan and placed orders. The Patient requires high complexity decision making with multiple systems involvement.  Rounds were done with the Respiratory Therapy Director and Staff therapists and discussed with nursing staff also.  06/23/20, MD [facility] Pulmonary Critical Care Medicine Sleep Medicine

## 2020-04-24 DIAGNOSIS — J9621 Acute and chronic respiratory failure with hypoxia: Secondary | ICD-10-CM | POA: Diagnosis not present

## 2020-04-24 DIAGNOSIS — A419 Sepsis, unspecified organism: Secondary | ICD-10-CM | POA: Diagnosis not present

## 2020-04-24 DIAGNOSIS — G9341 Metabolic encephalopathy: Secondary | ICD-10-CM | POA: Diagnosis not present

## 2020-04-24 DIAGNOSIS — J189 Pneumonia, unspecified organism: Secondary | ICD-10-CM | POA: Diagnosis not present

## 2020-04-24 NOTE — Progress Notes (Signed)
Pulmonary Critical Care Medicine [facility] [place]   PULMONARY CRITICAL CARE SERVICE  PROGRESS NOTE  Date of Service: 04/24/2020  Elizabeth Manning  [id number]  DOB: 02-Jan-1971   DOA: 04/10/2020  Referring Physician: Carron Curie, MD  HPI: Elizabeth Manning is a 49 y.o. female seen for follow up of Acute on Chronic Respiratory Failure.  Patient is on T collar on 28% FiO2 has been doing well basically ready for exchange of the tracheostomy  Medications: Reviewed on Rounds  Physical Exam:  Vitals: Temperature is 97.2 pulse 94 respiratory rate was 38 blood pressure is 132/76 saturations 100%  Ventilator Settings on aerosolized T collar currently on 20% FiO2  . General: Comfortable at this time . Eyes: Grossly normal lids, irises & conjunctiva . ENT: grossly tongue is normal . Neck: no obvious mass . Cardiovascular: S1 S2 normal no gallop . Respiratory: No rhonchi coarse breath sounds . Abdomen: soft . Skin: no rash seen on limited exam . Musculoskeletal: not rigid . Psychiatric:unable to assess . Neurologic: no seizure no involuntary movements         Lab Data:   Basic Metabolic Panel: Recent Labs  Lab 04/19/20 0943 04/21/20 1031 04/22/20 0617 04/23/20 0300  NA 121* 130* 131* 123*  K 4.3 3.9 4.3 4.9  CL 84* 96* 97* 89*  CO2 28 25 26 26   GLUCOSE 85 120* 94 118*  BUN <5* 5* 5* 7  CREATININE <0.30* <0.30* <0.30* <0.30*  CALCIUM 9.2 9.5 9.3 9.3    ABG: No results for input(s): PHART, PCO2ART, PO2ART, HCO3, O2SAT in the last 168 hours.  Liver Function Tests: No results for input(s): AST, ALT, ALKPHOS, BILITOT, PROT, ALBUMIN in the last 168 hours. No results for input(s): LIPASE, AMYLASE in the last 168 hours. No results for input(s): AMMONIA in the last 168 hours.  CBC: Recent Labs  Lab 04/19/20 0943 04/23/20 0300  WBC 16.8* 11.5*  HGB 9.7* 9.6*  HCT 28.7* 29.3*  MCV 88.0 90.7  PLT 462* 438*    Cardiac Enzymes: No results for input(s):  CKTOTAL, CKMB, CKMBINDEX, TROPONINI in the last 168 hours.  BNP (last 3 results) No results for input(s): BNP in the last 8760 hours.  ProBNP (last 3 results) No results for input(s): PROBNP in the last 8760 hours.  Radiological Exams: No results found.  Assessment/Plan Active Problems:   Acute on chronic respiratory failure with hypoxia (HCC)   Severe sepsis (HCC)   Metabolic encephalopathy   Bilateral pneumonia   Thrombocytopenia (HCC)   1. Acute on chronic respiratory failure hypoxia we will continue with T collar trials currently on 28% FiO2 go ahead and change out the tracheostomy today 2. Severe sepsis resolved hemodynamics are stable 3. Metabolic encephalopathy no change at this time we will continue to follow 4. Bilateral pneumonia treated improved 5. Thrombocytopenia following labs   I have personally seen and evaluated the patient, evaluated laboratory and imaging results, formulated the assessment and plan and placed orders. The Patient requires high complexity decision making with multiple systems involvement.  Rounds were done with the Respiratory Therapy Director and Staff therapists and discussed with nursing staff also.  06/23/20, MD [facility] Pulmonary Critical Care Medicine Sleep Medicine

## 2020-04-25 DIAGNOSIS — J189 Pneumonia, unspecified organism: Secondary | ICD-10-CM | POA: Diagnosis not present

## 2020-04-25 DIAGNOSIS — J9621 Acute and chronic respiratory failure with hypoxia: Secondary | ICD-10-CM | POA: Diagnosis not present

## 2020-04-25 DIAGNOSIS — G9341 Metabolic encephalopathy: Secondary | ICD-10-CM | POA: Diagnosis not present

## 2020-04-25 DIAGNOSIS — A419 Sepsis, unspecified organism: Secondary | ICD-10-CM | POA: Diagnosis not present

## 2020-04-25 LAB — BASIC METABOLIC PANEL
Anion gap: 9 (ref 5–15)
BUN: 5 mg/dL — ABNORMAL LOW (ref 6–20)
CO2: 25 mmol/L (ref 22–32)
Calcium: 9.4 mg/dL (ref 8.9–10.3)
Chloride: 96 mmol/L — ABNORMAL LOW (ref 98–111)
Creatinine, Ser: <0.3 mg/dL — ABNORMAL LOW (ref 0.44–1.00)
Glucose, Bld: 116 mg/dL — ABNORMAL HIGH (ref 70–99)
Potassium: 4.5 mmol/L (ref 3.5–5.1)
Sodium: 130 mmol/L — ABNORMAL LOW (ref 135–145)

## 2020-04-25 LAB — CBC
HCT: 30.2 % — ABNORMAL LOW (ref 36.0–46.0)
Hemoglobin: 9.9 g/dL — ABNORMAL LOW (ref 12.0–15.0)
MCH: 29.9 pg (ref 26.0–34.0)
MCHC: 32.8 g/dL (ref 30.0–36.0)
MCV: 91.2 fL (ref 80.0–100.0)
Platelets: 399 10*3/uL (ref 150–400)
RBC: 3.31 MIL/uL — ABNORMAL LOW (ref 3.87–5.11)
RDW: 17.2 % — ABNORMAL HIGH (ref 11.5–15.5)
WBC: 7.3 10*3/uL (ref 4.0–10.5)
nRBC: 0 % (ref 0.0–0.2)

## 2020-04-25 NOTE — Progress Notes (Addendum)
Pulmonary Critical Care Medicine [facility] [place]   PULMONARY CRITICAL CARE SERVICE  PROGRESS NOTE  Date of Service: 04/25/2020  TENIKA KEERAN  [id number]:[id number]  DOB: 07-11-1971   DOA: 04/10/2020  Referring Physician: Carron Curie, MD  HPI: MEEKAH MATH is a 49 y.o. female seen for follow up of Acute on Chronic Respiratory Failure.  Patient mains on 20% T-bar satting well no fever distress.  Medications: Reviewed on Rounds  Physical Exam:  Vitals: Pulse 77 respirations 32 BP 134/68 O2 sat 100% temp 98.1  Ventilator Settings T-bar 28%  . General: Comfortable at this time . Eyes: Grossly normal lids, irises & conjunctiva . ENT: grossly tongue is normal . Neck: no obvious mass . Cardiovascular: S1 S2 normal no gallop . Respiratory: No rales or rhonchi noted . Abdomen: soft . Skin: no rash seen on limited exam . Musculoskeletal: not rigid . Psychiatric:unable to assess . Neurologic: no seizure no involuntary movements         Lab Data:   Basic Metabolic Panel: Recent Labs  Lab 04/19/20 0943 04/21/20 1031 04/22/20 0617 04/23/20 0300 04/25/20 0533  NA 121* 130* 131* 123* 130*  K 4.3 3.9 4.3 4.9 4.5  CL 84* 96* 97* 89* 96*  CO2 28 25 26 26 25   GLUCOSE 85 120* 94 118* 116*  BUN <5* 5* 5* 7 5*  CREATININE <0.30* <0.30* <0.30* <0.30* <0.30*  CALCIUM 9.2 9.5 9.3 9.3 9.4    ABG: No results for input(s): PHART, PCO2ART, PO2ART, HCO3, O2SAT in the last 168 hours.  Liver Function Tests: No results for input(s): AST, ALT, ALKPHOS, BILITOT, PROT, ALBUMIN in the last 168 hours. No results for input(s): LIPASE, AMYLASE in the last 168 hours. No results for input(s): AMMONIA in the last 168 hours.  CBC: Recent Labs  Lab 04/19/20 0943 04/23/20 0300 04/25/20 0533  WBC 16.8* 11.5* 7.3  HGB 9.7* 9.6* 9.9*  HCT 28.7* 29.3* 30.2*  MCV 88.0 90.7 91.2  PLT 462* 438* 399    Cardiac Enzymes: No results for input(s): CKTOTAL, CKMB, CKMBINDEX, TROPONINI  in the last 168 hours.  BNP (last 3 results) No results for input(s): BNP in the last 8760 hours.  ProBNP (last 3 results) No results for input(s): PROBNP in the last 8760 hours.  Radiological Exams: No results found.  Assessment/Plan Active Problems:   Acute on chronic respiratory failure with hypoxia (HCC)   Severe sepsis (HCC)   Metabolic encephalopathy   Bilateral pneumonia   Thrombocytopenia (HCC)   1. Acute on chronic respiratory failure hypoxia we will continue with T collar trials currently on 28% FiO2  2. Severe sepsis resolved hemodynamics are stable 3. Metabolic encephalopathy no change at this time we will continue to follow 4. Bilateral pneumonia treated improved 5. Thrombocytopenia following labs   I have personally seen and evaluated the patient, evaluated laboratory and imaging results, formulated the assessment and plan and placed orders. The Patient requires high complexity decision making with multiple systems involvement.  Rounds were done with the Respiratory Therapy Director and Staff therapists and discussed with nursing staff also.  06/25/20, MD [facility] Pulmonary Critical Care Medicine Sleep Medicine

## 2020-04-26 DIAGNOSIS — G9341 Metabolic encephalopathy: Secondary | ICD-10-CM | POA: Diagnosis not present

## 2020-04-26 DIAGNOSIS — J9621 Acute and chronic respiratory failure with hypoxia: Secondary | ICD-10-CM | POA: Diagnosis not present

## 2020-04-26 DIAGNOSIS — A419 Sepsis, unspecified organism: Secondary | ICD-10-CM | POA: Diagnosis not present

## 2020-04-26 DIAGNOSIS — J189 Pneumonia, unspecified organism: Secondary | ICD-10-CM | POA: Diagnosis not present

## 2020-04-26 NOTE — Progress Notes (Signed)
Pulmonary Critical Care Medicine [facility] [place]   PULMONARY CRITICAL CARE SERVICE  PROGRESS NOTE  Date of Service: 04/26/2020  Elizabeth Manning  OAC:[id number]  DOB: 05/03/1971   DOA: 04/10/2020  Referring Physician: Carron Curie, MD  HPI: Elizabeth Manning is a 49 y.o. female seen for follow up of Acute on Chronic Respiratory Failure.  Currently is on T collar has been on 28% FiO2 has not yet been tried on capping  Medications: Reviewed on Rounds  Physical Exam:  Vitals: Temperature is 98.6 pulse 87 respiratory rate 38 blood pressure is 122/68 saturations 98%  Ventilator Settings on T collar were then FiO2 of 28%  . General: Comfortable at this time . Eyes: Grossly normal lids, irises & conjunctiva . ENT: grossly tongue is normal . Neck: no obvious mass . Cardiovascular: S1 S2 normal no gallop . Respiratory: No rhonchi coarse breath sounds . Abdomen: soft . Skin: no rash seen on limited exam . Musculoskeletal: not rigid . Psychiatric:unable to assess . Neurologic: no seizure no involuntary movements         Lab Data:   Basic Metabolic Panel: Recent Labs  Lab 04/21/20 1031 04/22/20 0617 04/23/20 0300 04/25/20 0533  NA 130* 131* 123* 130*  K 3.9 4.3 4.9 4.5  CL 96* 97* 89* 96*  CO2 25 26 26 25   GLUCOSE 120* 94 118* 116*  BUN 5* 5* 7 5*  CREATININE <0.30* <0.30* <0.30* <0.30*  CALCIUM 9.5 9.3 9.3 9.4    ABG: No results for input(s): PHART, PCO2ART, PO2ART, HCO3, O2SAT in the last 168 hours.  Liver Function Tests: No results for input(s): AST, ALT, ALKPHOS, BILITOT, PROT, ALBUMIN in the last 168 hours. No results for input(s): LIPASE, AMYLASE in the last 168 hours. No results for input(s): AMMONIA in the last 168 hours.  CBC: Recent Labs  Lab 04/23/20 0300 04/25/20 0533  WBC 11.5* 7.3  HGB 9.6* 9.9*  HCT 29.3* 30.2*  MCV 90.7 91.2  PLT 438* 399    Cardiac Enzymes: No results for input(s): CKTOTAL, CKMB, CKMBINDEX, TROPONINI in the  last 168 hours.  BNP (last 3 results) No results for input(s): BNP in the last 8760 hours.  ProBNP (last 3 results) No results for input(s): PROBNP in the last 8760 hours.  Radiological Exams: No results found.  Assessment/Plan Active Problems:   Acute on chronic respiratory failure with hypoxia (HCC)   Severe sepsis (HCC)   Metabolic encephalopathy   Bilateral pneumonia   Thrombocytopenia (HCC)   1. Acute on chronic respiratory failure with hypoxia we will continue to advance the weaning trial capping trials. 2. Severe sepsis resolved 3. Metabolic encephalopathy improving 4. Bilateral pneumonia treated improving 5. Thrombocytopenia no change supportive care monitoring labs   I have personally seen and evaluated the patient, evaluated laboratory and imaging results, formulated the assessment and plan and placed orders. The Patient requires high complexity decision making with multiple systems involvement.  Rounds were done with the Respiratory Therapy Director and Staff therapists and discussed with nursing staff also.  06/25/20, MD [facility] Pulmonary Critical Care Medicine Sleep Medicine

## 2020-04-27 DIAGNOSIS — J9621 Acute and chronic respiratory failure with hypoxia: Secondary | ICD-10-CM | POA: Diagnosis not present

## 2020-04-27 DIAGNOSIS — J189 Pneumonia, unspecified organism: Secondary | ICD-10-CM | POA: Diagnosis not present

## 2020-04-27 DIAGNOSIS — A419 Sepsis, unspecified organism: Secondary | ICD-10-CM | POA: Diagnosis not present

## 2020-04-27 DIAGNOSIS — G9341 Metabolic encephalopathy: Secondary | ICD-10-CM | POA: Diagnosis not present

## 2020-04-27 NOTE — Progress Notes (Signed)
Pulmonary Critical Care Medicine [facility] [place]   PULMONARY CRITICAL CARE SERVICE  PROGRESS NOTE  Date of Service: 04/27/2020  Elizabeth Manning  WUJ:[id number]  DOB: 08-Jul-1971   DOA: 04/10/2020  Referring Physician: Carron Curie, MD  HPI: Elizabeth Manning is a 49 y.o. female seen for follow up of Acute on Chronic Respiratory Failure.  Patient currently is capping on nasal cannula doing fairly well  Medications: Reviewed on Rounds  Physical Exam:  Vitals: Temperature is 98.1 pulse 83 respiratory 38 blood pressure is 149/79 saturations 97%  Ventilator Settings and capping trials with nasal cannula  . General: Comfortable at this time . Eyes: Grossly normal lids, irises & conjunctiva . ENT: grossly tongue is normal . Neck: no obvious mass . Cardiovascular: S1 S2 normal no gallop . Respiratory: No rhonchi coarse breath sounds . Abdomen: soft . Skin: no rash seen on limited exam . Musculoskeletal: not rigid . Psychiatric:unable to assess . Neurologic: no seizure no involuntary movements         Lab Data:   Basic Metabolic Panel: Recent Labs  Lab 04/21/20 1031 04/22/20 0617 04/23/20 0300 04/25/20 0533  NA 130* 131* 123* 130*  K 3.9 4.3 4.9 4.5  CL 96* 97* 89* 96*  CO2 25 26 26 25   GLUCOSE 120* 94 118* 116*  BUN 5* 5* 7 5*  CREATININE <0.30* <0.30* <0.30* <0.30*  CALCIUM 9.5 9.3 9.3 9.4    ABG: No results for input(s): PHART, PCO2ART, PO2ART, HCO3, O2SAT in the last 168 hours.  Liver Function Tests: No results for input(s): AST, ALT, ALKPHOS, BILITOT, PROT, ALBUMIN in the last 168 hours. No results for input(s): LIPASE, AMYLASE in the last 168 hours. No results for input(s): AMMONIA in the last 168 hours.  CBC: Recent Labs  Lab 04/23/20 0300 04/25/20 0533  WBC 11.5* 7.3  HGB 9.6* 9.9*  HCT 29.3* 30.2*  MCV 90.7 91.2  PLT 438* 399    Cardiac Enzymes: No results for input(s): CKTOTAL, CKMB, CKMBINDEX, TROPONINI in the last 168  hours.  BNP (last 3 results) No results for input(s): BNP in the last 8760 hours.  ProBNP (last 3 results) No results for input(s): PROBNP in the last 8760 hours.  Radiological Exams: No results found.  Assessment/Plan Active Problems:   Acute on chronic respiratory failure with hypoxia (HCC)   Severe sepsis (HCC)   Metabolic encephalopathy   Bilateral pneumonia   Thrombocytopenia (HCC)   1. Acute on chronic respiratory failure hypoxia doing fine with capping we will continue supportive care 2. Severe sepsis resolved 3. Metabolic encephalopathy at baseline continue supportive care 4. Bilateral pneumonia treated 5. Thrombocytopenia resolved   I have personally seen and evaluated the patient, evaluated laboratory and imaging results, formulated the assessment and plan and placed orders. The Patient requires high complexity decision making with multiple systems involvement.  Rounds were done with the Respiratory Therapy Director and Staff therapists and discussed with nursing staff also.  06/25/20, MD [facility] Pulmonary Critical Care Medicine Sleep Medicine

## 2020-04-28 DIAGNOSIS — G9341 Metabolic encephalopathy: Secondary | ICD-10-CM | POA: Diagnosis not present

## 2020-04-28 DIAGNOSIS — J9621 Acute and chronic respiratory failure with hypoxia: Secondary | ICD-10-CM | POA: Diagnosis not present

## 2020-04-28 DIAGNOSIS — A419 Sepsis, unspecified organism: Secondary | ICD-10-CM | POA: Diagnosis not present

## 2020-04-28 DIAGNOSIS — J189 Pneumonia, unspecified organism: Secondary | ICD-10-CM | POA: Diagnosis not present

## 2020-04-28 NOTE — Progress Notes (Signed)
Pulmonary Critical Care Medicine [facility] [place]   PULMONARY CRITICAL CARE SERVICE  PROGRESS NOTE  Date of Service: 04/28/2020  Elizabeth Manning  [id number]  DOB: Mar 11, 1971   DOA: 04/10/2020  Referring Physician: Carron Curie, MD  HPI: Elizabeth Manning is a 49 y.o. female seen for follow up of Acute on Chronic Respiratory Failure.  Patient is capping now on room air has been going on for 48 hours.  We will continue with advancing  Medications: Reviewed on Rounds  Physical Exam:  Vitals: Temperature is 97.3 pulse 76 respiratory 28 blood pressure is 135/75 saturations 100%  Ventilator Settings capping on room air  . General: Comfortable at this time . Eyes: Grossly normal lids, irises & conjunctiva . ENT: grossly tongue is normal . Neck: no obvious mass . Cardiovascular: S1 S2 normal no gallop . Respiratory: No rhonchi coarse breath sounds . Abdomen: soft . Skin: no rash seen on limited exam . Musculoskeletal: not rigid . Psychiatric:unable to assess . Neurologic: no seizure no involuntary movements         Lab Data:   Basic Metabolic Panel: Recent Labs  Lab 04/22/20 0617 04/23/20 0300 04/25/20 0533  NA 131* 123* 130*  K 4.3 4.9 4.5  CL 97* 89* 96*  CO2 26 26 25   GLUCOSE 94 118* 116*  BUN 5* 7 5*  CREATININE <0.30* <0.30* <0.30*  CALCIUM 9.3 9.3 9.4    ABG: No results for input(s): PHART, PCO2ART, PO2ART, HCO3, O2SAT in the last 168 hours.  Liver Function Tests: No results for input(s): AST, ALT, ALKPHOS, BILITOT, PROT, ALBUMIN in the last 168 hours. No results for input(s): LIPASE, AMYLASE in the last 168 hours. No results for input(s): AMMONIA in the last 168 hours.  CBC: Recent Labs  Lab 04/23/20 0300 04/25/20 0533  WBC 11.5* 7.3  HGB 9.6* 9.9*  HCT 29.3* 30.2*  MCV 90.7 91.2  PLT 438* 399    Cardiac Enzymes: No results for input(s): CKTOTAL, CKMB, CKMBINDEX, TROPONINI in the last 168 hours.  BNP (last 3 results) No  results for input(s): BNP in the last 8760 hours.  ProBNP (last 3 results) No results for input(s): PROBNP in the last 8760 hours.  Radiological Exams: No results found.  Assessment/Plan Active Problems:   Acute on chronic respiratory failure with hypoxia (HCC)   Severe sepsis (HCC)   Metabolic encephalopathy   Bilateral pneumonia   Thrombocytopenia (HCC)   1. Acute on chronic respiratory failure hypoxia we will continue with capping trials continue secretion management supportive care.  Patient will be completing 48 hours today 2. Severe sepsis resolved 3. Metabolic encephalopathy no change continue to follow 4. Bilateral pneumonia treated we will continue supportive care 5. Thrombocytopenia at baseline   I have personally seen and evaluated the patient, evaluated laboratory and imaging results, formulated the assessment and plan and placed orders. The Patient requires high complexity decision making with multiple systems involvement.  Rounds were done with the Respiratory Therapy Director and Staff therapists and discussed with nursing staff also.  06/25/20, MD [facility] Pulmonary Critical Care Medicine Sleep Medicine

## 2020-04-29 DIAGNOSIS — A419 Sepsis, unspecified organism: Secondary | ICD-10-CM | POA: Diagnosis not present

## 2020-04-29 DIAGNOSIS — G9341 Metabolic encephalopathy: Secondary | ICD-10-CM | POA: Diagnosis not present

## 2020-04-29 DIAGNOSIS — J9621 Acute and chronic respiratory failure with hypoxia: Secondary | ICD-10-CM | POA: Diagnosis not present

## 2020-04-29 DIAGNOSIS — J189 Pneumonia, unspecified organism: Secondary | ICD-10-CM | POA: Diagnosis not present

## 2020-04-29 LAB — BASIC METABOLIC PANEL
Anion gap: 11 (ref 5–15)
BUN: 14 mg/dL (ref 6–20)
CO2: 27 mmol/L (ref 22–32)
Calcium: 10.6 mg/dL — ABNORMAL HIGH (ref 8.9–10.3)
Chloride: 92 mmol/L — ABNORMAL LOW (ref 98–111)
Creatinine, Ser: 0.31 mg/dL — ABNORMAL LOW (ref 0.44–1.00)
GFR calc Af Amer: >60 mL/min (ref >60)
GFR calc non Af Amer: >60 mL/min (ref >60)
Glucose, Bld: 94 mg/dL (ref 70–99)
Potassium: 4.3 mmol/L (ref 3.5–5.1)
Sodium: 130 mmol/L — ABNORMAL LOW (ref 135–145)

## 2020-04-29 NOTE — Progress Notes (Signed)
Pulmonary Critical Care Medicine [facility] [place]   PULMONARY CRITICAL CARE SERVICE  PROGRESS NOTE  Date of Service: 04/29/2020  [name]  KWI:[id number]  DOB: [birth year]   DOA: 04/10/2020  Referring Physician: Carron Curie, MD  HPI: Elizabeth Manning is a 49 y.o. female seen for follow up of Acute on Chronic Respiratory Failure.  Patient currently is capping on room air today will be greater than 72 hours ready for decannulation no secretions  Medications: Reviewed on Rounds  Physical Exam:  Vitals: Temperature is 97.5 pulse 101 respiratory 30 blood pressure is 116/61 saturations 97%  Ventilator Settings capping on room air  . General: Comfortable at this time . Eyes: Grossly normal lids, irises & conjunctiva . ENT: grossly tongue is normal . Neck: no obvious mass . Cardiovascular: S1 S2 normal no gallop . Respiratory: No rhonchi no rales are noted at this time . Abdomen: soft . Skin: no rash seen on limited exam . Musculoskeletal: not rigid . Psychiatric:unable to assess . Neurologic: no seizure no involuntary movements         Lab Data:   Basic Metabolic Panel: Recent Labs  Lab 04/23/20 0300 04/25/20 0533 04/29/20 0525  NA 123* 130* 130*  K 4.9 4.5 4.3  CL 89* 96* 92*  CO2 26 25 27   GLUCOSE 118* 116* 94  BUN 7 5* 14  CREATININE <0.30* <0.30* 0.31*  CALCIUM 9.3 9.4 10.6*    ABG: No results for input(s): PHART, PCO2ART, PO2ART, HCO3, O2SAT in the last 168 hours.  Liver Function Tests: No results for input(s): AST, ALT, ALKPHOS, BILITOT, PROT, ALBUMIN in the last 168 hours. No results for input(s): LIPASE, AMYLASE in the last 168 hours. No results for input(s): AMMONIA in the last 168 hours.  CBC: Recent Labs  Lab 04/23/20 0300 04/25/20 0533  WBC 11.5* 7.3  HGB 9.6* 9.9*  HCT 29.3* 30.2*  MCV 90.7 91.2  PLT 438* 399    Cardiac Enzymes: No results for input(s): CKTOTAL, CKMB, CKMBINDEX, TROPONINI in the last 168  hours.  BNP (last 3 results) No results for input(s): BNP in the last 8760 hours.  ProBNP (last 3 results) No results for input(s): PROBNP in the last 8760 hours.  Radiological Exams: No results found.  Assessment/Plan Active Problems:   Acute on chronic respiratory failure with hypoxia (HCC)   Severe sepsis (HCC)   Metabolic encephalopathy   Bilateral pneumonia   Thrombocytopenia (HCC)   1. Acute on chronic respiratory failure hypoxia plan is to continue with the weaning process and proceed to decannulation 2. Severe sepsis resolved hemodynamics stable 3. Metabolic encephalopathy no change 4. Bilateral pneumonia treated improving 5. Thrombocytopenia at baseline we will continue with supportive care   I have personally seen and evaluated the patient, evaluated laboratory and imaging results, formulated the assessment and plan and placed orders. The Patient requires high complexity decision making with multiple systems involvement.  Rounds were done with the Respiratory Therapy Director and Staff therapists and discussed with nursing staff also.  06/25/20, MD [facility] Pulmonary Critical Care Medicine Sleep Medicine

## 2020-04-30 ENCOUNTER — Other Ambulatory Visit (HOSPITAL_COMMUNITY): Payer: BC Managed Care – PPO

## 2020-05-02 LAB — BASIC METABOLIC PANEL
Anion gap: 6 (ref 5–15)
BUN: 20 mg/dL (ref 6–20)
CO2: 31 mmol/L (ref 22–32)
Calcium: 10.5 mg/dL — ABNORMAL HIGH (ref 8.9–10.3)
Chloride: 99 mmol/L (ref 98–111)
Creatinine, Ser: <0.3 mg/dL — ABNORMAL LOW (ref 0.44–1.00)
Glucose, Bld: 108 mg/dL — ABNORMAL HIGH (ref 70–99)
Potassium: 3.8 mmol/L (ref 3.5–5.1)
Sodium: 136 mmol/L (ref 135–145)

## 2020-05-05 LAB — BASIC METABOLIC PANEL
Anion gap: 9 (ref 5–15)
BUN: 25 mg/dL — ABNORMAL HIGH (ref 6–20)
CO2: 32 mmol/L (ref 22–32)
Calcium: 11 mg/dL — ABNORMAL HIGH (ref 8.9–10.3)
Chloride: 102 mmol/L (ref 98–111)
Creatinine, Ser: 0.32 mg/dL — ABNORMAL LOW (ref 0.44–1.00)
GFR calc Af Amer: >60 mL/min (ref >60)
GFR calc non Af Amer: >60 mL/min (ref >60)
Glucose, Bld: 109 mg/dL — ABNORMAL HIGH (ref 70–99)
Potassium: 4.1 mmol/L (ref 3.5–5.1)
Sodium: 143 mmol/L (ref 135–145)

## 2020-05-05 LAB — CBC
HCT: 36.4 % (ref 36.0–46.0)
Hemoglobin: 11.1 g/dL — ABNORMAL LOW (ref 12.0–15.0)
MCH: 30.2 pg (ref 26.0–34.0)
MCHC: 30.5 g/dL (ref 30.0–36.0)
MCV: 99.2 fL (ref 80.0–100.0)
Platelets: 488 10*3/uL — ABNORMAL HIGH (ref 150–400)
RBC: 3.67 MIL/uL — ABNORMAL LOW (ref 3.87–5.11)
RDW: 18.4 % — ABNORMAL HIGH (ref 11.5–15.5)
WBC: 11.4 10*3/uL — ABNORMAL HIGH (ref 4.0–10.5)
nRBC: 0 % (ref 0.0–0.2)

## 2020-05-07 LAB — URINALYSIS, ROUTINE W REFLEX MICROSCOPIC
Bilirubin Urine: NEGATIVE
Glucose, UA: NEGATIVE mg/dL
Hgb urine dipstick: NEGATIVE
Ketones, ur: NEGATIVE mg/dL
Leukocytes,Ua: NEGATIVE
Nitrite: NEGATIVE
Protein, ur: NEGATIVE mg/dL
Specific Gravity, Urine: 1.021 (ref 1.005–1.030)
pH: 5 (ref 5.0–8.0)

## 2020-05-07 LAB — TSH: TSH: 2.125 u[IU]/mL (ref 0.350–4.500)

## 2020-05-08 LAB — URINE CULTURE: Culture: NO GROWTH

## 2021-07-01 IMAGING — DX DG ABDOMEN 1V
1 series · 1 of 1 positions shown · non-contrast
Comparison: None.

CLINICAL DATA: Tube placement

EXAM:
ABDOMEN - 1 VIEW

[abdomen]
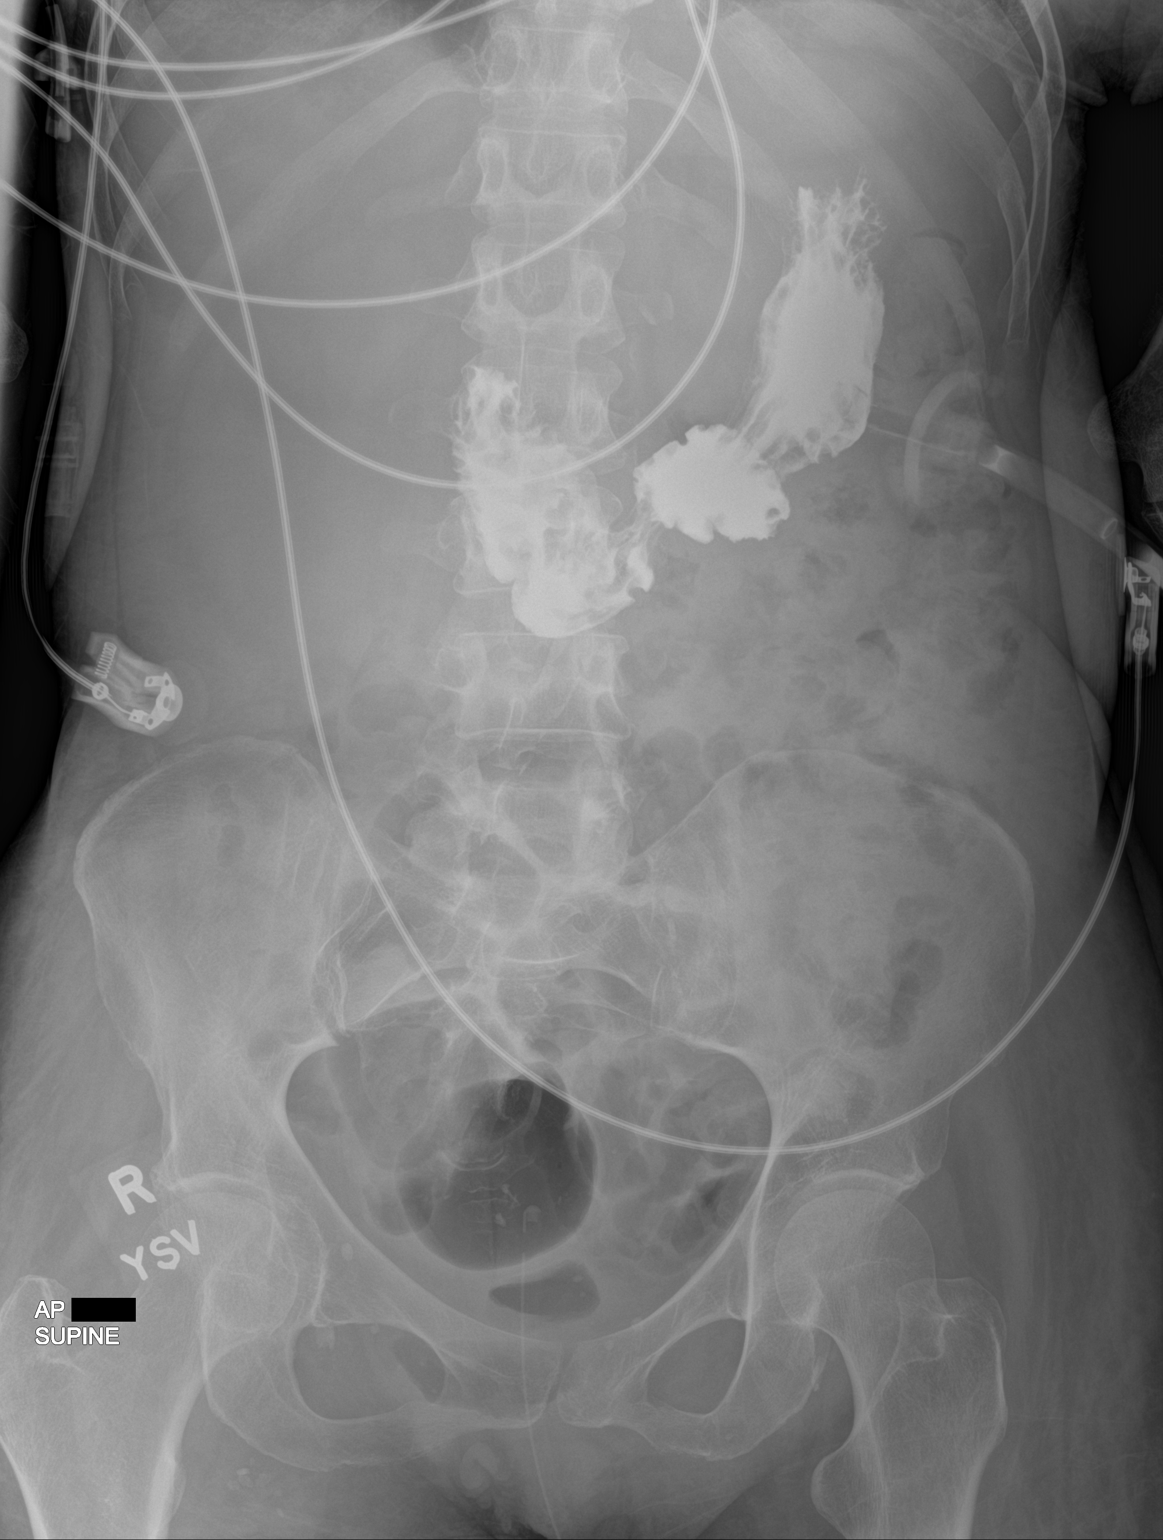

[1 of 1 positions shown; findings below may reference images not displayed]

FINDINGS: Injection of contrast through the pre-existing PEG tube opacifies
the stomach. There is no free air. The bowel gas pattern is
nonobstructive.
IMPRESSION: Peg tube in the stomach.

## 2021-07-08 IMAGING — CT CT CHEST W/ CM
2 of 5 series · 12 of 36 positions shown, 15 images · IV contrast (omnipaque)
Comparison: One-view abdomen and one-view chest 04/11/2020

CLINICAL DATA: Right upper quadrant abdominal pain. Positive Murphy
sign. Leukocytosis. Sepsis. Acute on chronic respiratory failure.

EXAM:
CT CHEST, ABDOMEN, AND PELVIS WITH CONTRAST
TECHNIQUE: Multidetector CT imaging of the chest, abdomen and pelvis was
performed following the standard protocol during bolus
administration of intravenous contrast.
CONTRAST:  100mL OMNIPAQUE IOHEXOL 300 MG/ML  SOLN

[Series 3: cap with 5mm st · axial · 0.97mm/px · z∈[+667,+1207]mm · 9 of 132 slices shown, 12 images]
[im 12/132  mediastinal]
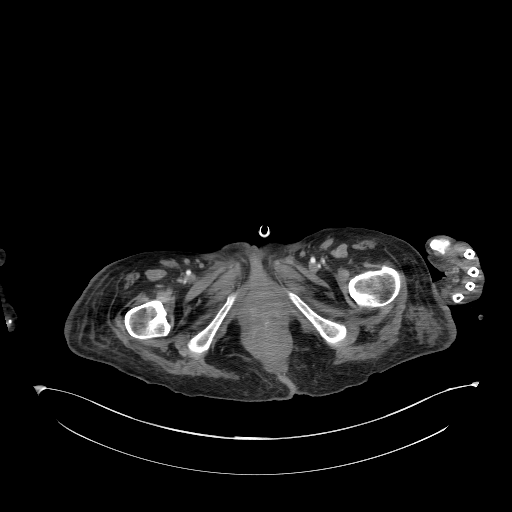
[im 12/132  lung]
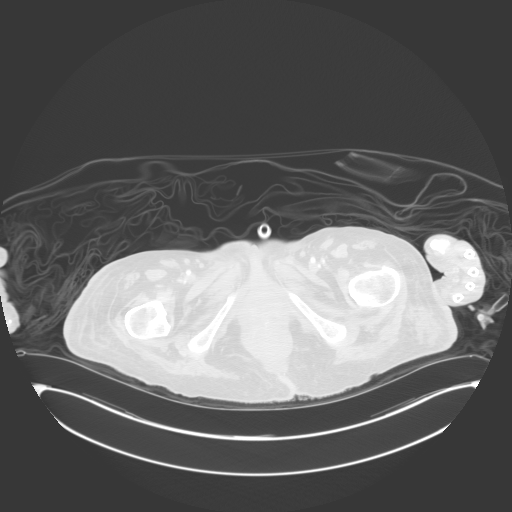
[im 24/132  lung]
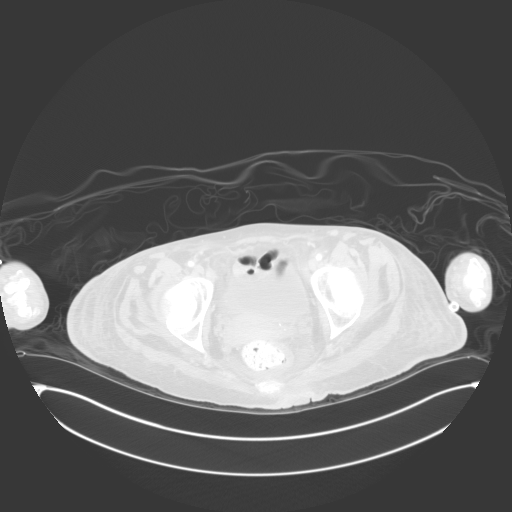
[im 36/132  lung]
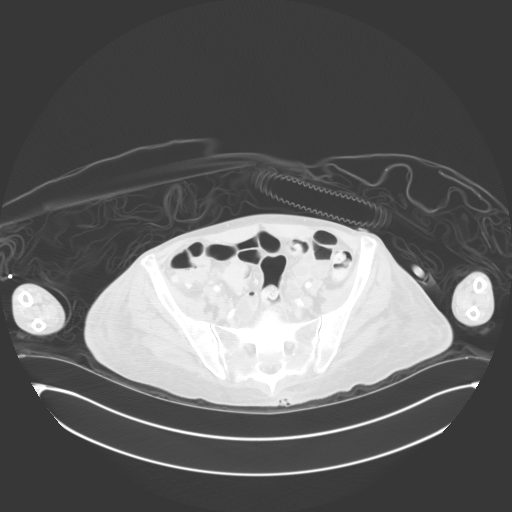
[im 48/132  lung]
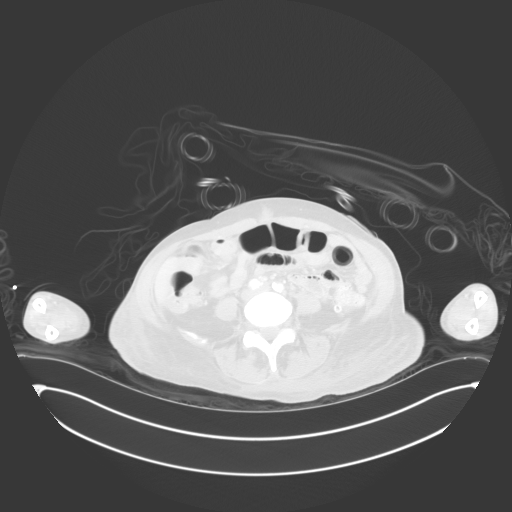
[im 72/132  mediastinal]
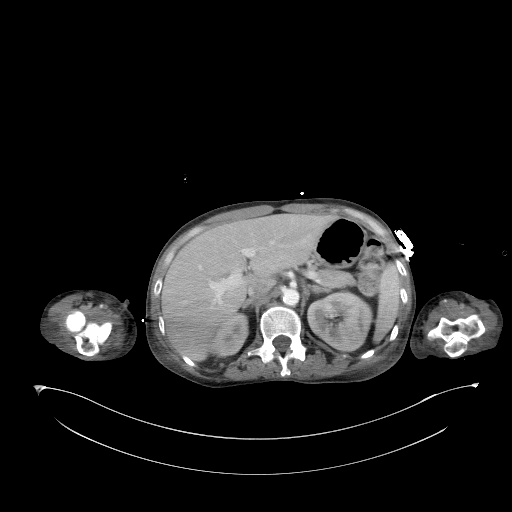
[im 72/132  lung]
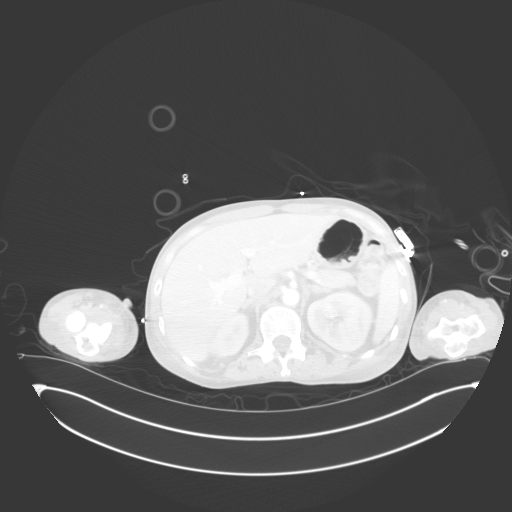
[im 84/132  lung]
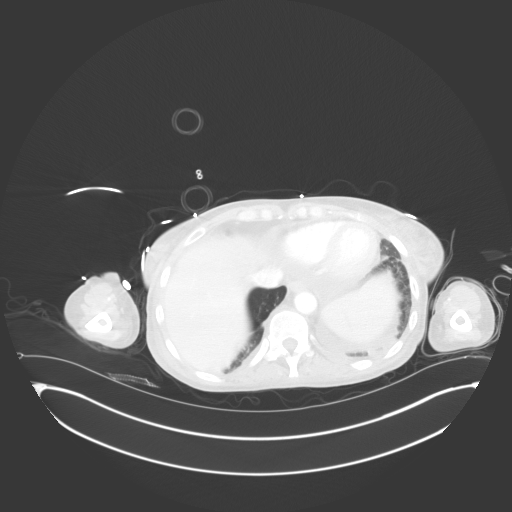
[im 96/132  lung]
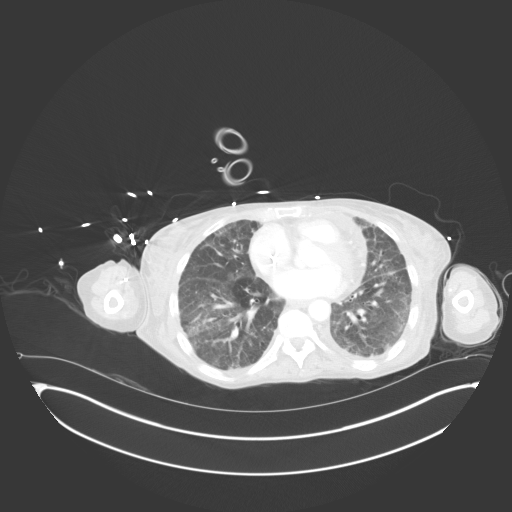
[im 108/132  lung]
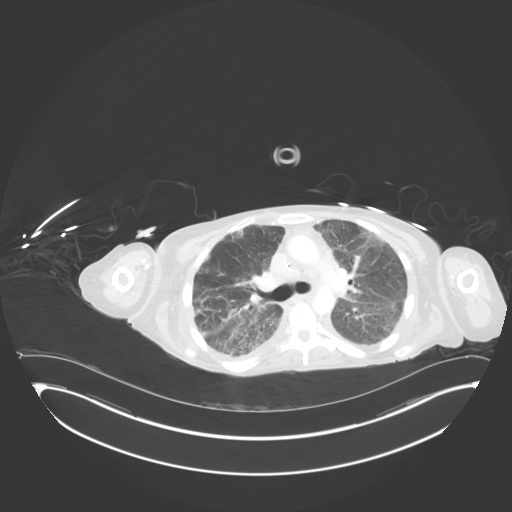
[im 120/132  mediastinal]
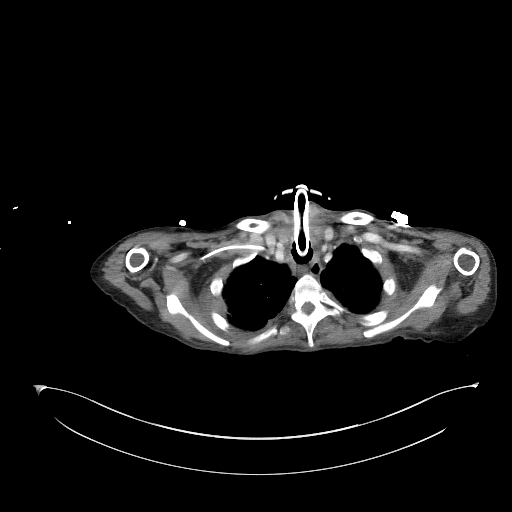
[im 120/132  lung]
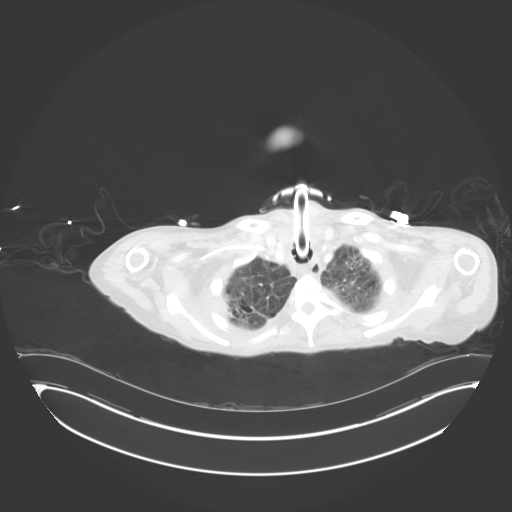

[Series 5: cap with 3mm st cor · coronal · 0.84mm/px · 3 of 148 slices shown]
[im 30/148  lung]
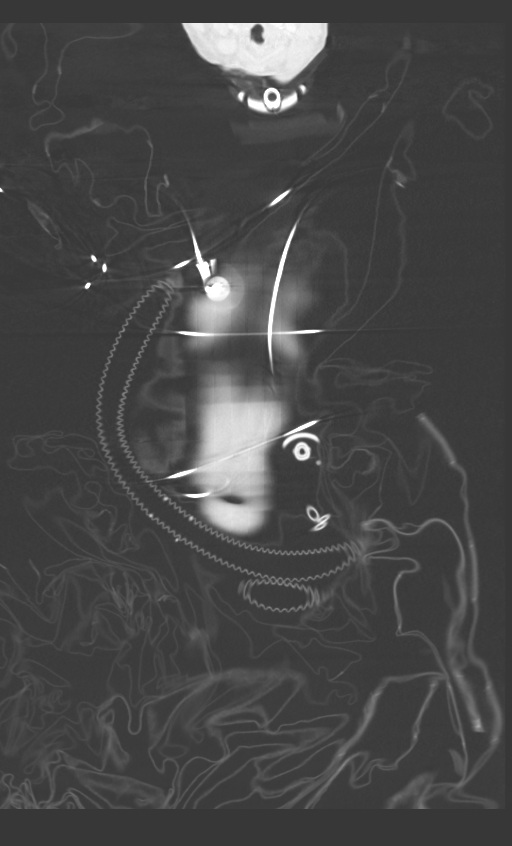
[im 59/148  lung]
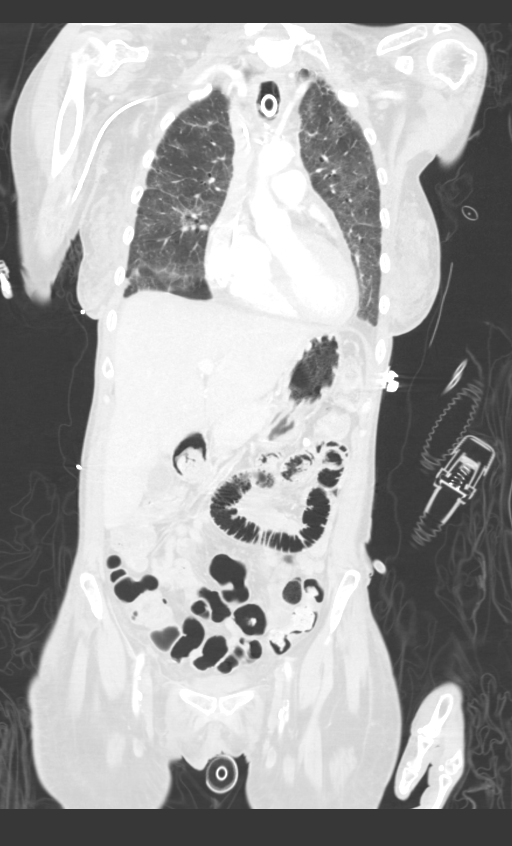
[im 89/148  lung]
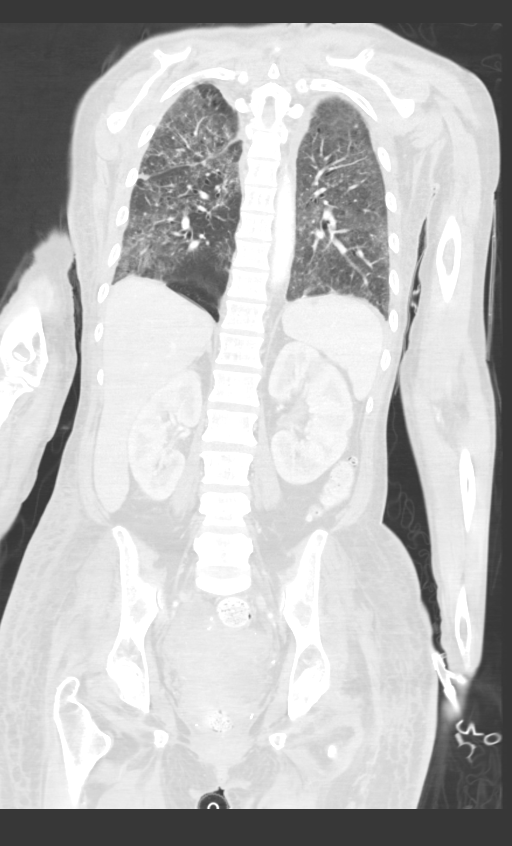

[12 of 36 positions shown; findings below may reference images not displayed]

FINDINGS: CT CHEST FINDINGS

Cardiovascular: The heart size is normal. Small amount pericardial
fluid is upper limits of normal. Atherosclerotic calcifications are
present the aortic arch and origins the great vessels without
aneurysm. Pulmonary arteries are unremarkable.

Mediastinum/Nodes: Tracheostomy tube is noted and in satisfactory
position. No significant adenopathy is present. Esophagus is
unremarkable. Thoracic inlet is otherwise normal.

Lungs/Pleura: Centrilobular emphysematous changes are present.
Patchy airspace opacities are noted bilaterally. Two left upper lobe
pulmonary nodules measure 8 mm each. Focal thickening is noted along
the inferior aspect of the left major fissure. Small effusions are
present, right greater than left. There is some nodularity
posteriorly on the right. No significant airspace consolidation is
present. Airways are patent.

Musculoskeletal: Vertebral body heights and alignment are normal. No
acute or healing fractures are present. No focal lytic or blastic
lesions are present.

CT ABDOMEN PELVIS FINDINGS

Hepatobiliary: No discrete hepatic lesions are present. Borderline
gallbladder wall thickening is present. No stones are visible. No
significant phlegm [REDACTED] changes are present about the gallbladder.
The common bile duct is within normal limits.

Pancreas: Unremarkable. No pancreatic ductal dilatation or
surrounding inflammatory changes.

Spleen: Normal in size without focal abnormality.

Adrenals/Urinary Tract: The adrenal glands are normal bilaterally.
Heterogeneous pattern of kidneys on delayed images, left greater
than right, suggests infection. No abscess is present. Collecting
system opacification is normal bilaterally. The urinary bladder is
within normal limits.

Stomach/Bowel: Peg tube is in place. Stomach and duodenum are
otherwise within normal limits. Small bowel is unremarkable. The
appendix is not discretely visualized and may be surgically absent.
Diverticular changes are present in the ascending colon without
focal inflammation. Transverse colon is normal. Diverticular changes
are present within the distal descending and sigmoid colon without
focal inflammation.

Vascular/Lymphatic: Atherosclerotic calcifications are present in
the aorta and branch vessels. Dense calcifications are present at
the bifurcation. Lumen is narrowed to at least 50%. No significant
adenopathy is present.

Reproductive: Heterogeneous appearance of the uterus is noted.
Calcified fibroids are present. Uterus and adnexa are otherwise
within normal limits.

Other: Small amount of free fluid layers dependently in the anatomic
pelvis. No discrete collections or abscess is present. No free air
is present. No significant ventral hernia is present.

Musculoskeletal: Vertebral body heights and alignment are normal. No
focal lytic or blastic lesions are present. Bony pelvis is normal.
Hips are located and within normal limits.
IMPRESSION: 1. Heterogeneous pattern of kidneys on delayed images, left greater
than right, suggests infection, pyelonephritis.
2. Small amount of free fluid layers dependently in the anatomic
pelvis. No discrete collections or abscess is present.
3. Borderline gallbladder wall thickening without stones or
significant pericholecystic inflammatory changes. Right upper
quadrant ultrasound would be useful for further evaluation if
clinically indicated.
4. Two left upper lobe pulmonary nodules measure 8 mm each. These
are likely inflammatory. Recommend follow-up CT of the chest without
contrast following resolution of current symptoms.
5. Small bilateral pleural effusions, right greater than left.
6. Aortic Atherosclerosis ([id number]) and Emphysema ([id number]).
